# Patient Record
Sex: Male | Born: 1950 | Race: Black or African American | Hispanic: No | Smoking: Former smoker
Health system: Southern US, Community
[De-identification: ages and names within clinical notes are randomized; demographics above are authoritative.]

## PROBLEM LIST (undated history)

## (undated) DIAGNOSIS — M1990 Unspecified osteoarthritis, unspecified site: Secondary | ICD-10-CM

## (undated) DIAGNOSIS — K529 Noninfective gastroenteritis and colitis, unspecified: Secondary | ICD-10-CM

## (undated) DIAGNOSIS — N289 Disorder of kidney and ureter, unspecified: Secondary | ICD-10-CM

## (undated) DIAGNOSIS — J449 Chronic obstructive pulmonary disease, unspecified: Secondary | ICD-10-CM

## (undated) DIAGNOSIS — K319 Disease of stomach and duodenum, unspecified: Secondary | ICD-10-CM

## (undated) DIAGNOSIS — I10 Essential (primary) hypertension: Secondary | ICD-10-CM

## (undated) DIAGNOSIS — I509 Heart failure, unspecified: Secondary | ICD-10-CM

## (undated) DIAGNOSIS — M069 Rheumatoid arthritis, unspecified: Secondary | ICD-10-CM

---

## 2012-11-18 ENCOUNTER — Emergency Department (HOSPITAL_COMMUNITY): Payer: Medicare Other

## 2012-11-18 ENCOUNTER — Emergency Department (HOSPITAL_COMMUNITY)
Admission: EM | Admit: 2012-11-18 | Discharge: 2012-11-18 | Disposition: A | Discharge status: Home / Self Care | Payer: Medicare Other | Attending: Emergency Medicine | Admitting: Emergency Medicine

## 2012-11-18 ENCOUNTER — Encounter (HOSPITAL_COMMUNITY): Payer: Self-pay | Admitting: Emergency Medicine

## 2012-11-18 DIAGNOSIS — F17200 Nicotine dependence, unspecified, uncomplicated: Secondary | ICD-10-CM | POA: Diagnosis not present

## 2012-11-18 DIAGNOSIS — Z8739 Personal history of other diseases of the musculoskeletal system and connective tissue: Secondary | ICD-10-CM | POA: Insufficient documentation

## 2012-11-18 DIAGNOSIS — R300 Dysuria: Secondary | ICD-10-CM | POA: Diagnosis not present

## 2012-11-18 DIAGNOSIS — Z8719 Personal history of other diseases of the digestive system: Secondary | ICD-10-CM | POA: Insufficient documentation

## 2012-11-18 DIAGNOSIS — R109 Unspecified abdominal pain: Secondary | ICD-10-CM | POA: Diagnosis present

## 2012-11-18 DIAGNOSIS — I10 Essential (primary) hypertension: Secondary | ICD-10-CM | POA: Insufficient documentation

## 2012-11-18 DIAGNOSIS — R1012 Left upper quadrant pain: Secondary | ICD-10-CM | POA: Insufficient documentation

## 2012-11-18 HISTORY — DX: Unspecified osteoarthritis, unspecified site: M19.90

## 2012-11-18 HISTORY — DX: Disease of stomach and duodenum, unspecified: K31.9

## 2012-11-18 HISTORY — DX: Essential (primary) hypertension: I10

## 2012-11-18 HISTORY — DX: Noninfective gastroenteritis and colitis, unspecified: K52.9

## 2012-11-18 LAB — COMPREHENSIVE METABOLIC PANEL
Albumin: 3.3 g/dL — ABNORMAL LOW (ref 3.5–5.2)
Alkaline Phosphatase: 130 U/L — ABNORMAL HIGH (ref 39–117)
BUN: 10 mg/dL (ref 6–23)
Chloride: 96 mEq/L (ref 96–112)
Creatinine, Ser: 0.81 mg/dL (ref 0.50–1.35)
GFR calc Af Amer: >90 mL/min (ref >90)
Total Bilirubin: 0.4 mg/dL (ref 0.3–1.2)

## 2012-11-18 LAB — URINALYSIS, ROUTINE W REFLEX MICROSCOPIC
Bilirubin Urine: NEGATIVE
Ketones, ur: NEGATIVE mg/dL
Nitrite: NEGATIVE
Urobilinogen, UA: 0.2 mg/dL (ref 0.0–1.0)

## 2012-11-18 LAB — CBC WITH DIFFERENTIAL/PLATELET
Basophils Absolute: 0 10*3/uL (ref 0.0–0.1)
Basophils Relative: 0 % (ref 0–1)
Eosinophils Absolute: 0 10*3/uL (ref 0.0–0.7)
Eosinophils Relative: 0 % (ref 0–5)
HCT: 39.2 % (ref 39.0–52.0)
Hemoglobin: 13.6 g/dL (ref 13.0–17.0)
MCH: 32.4 pg (ref 26.0–34.0)
MCHC: 34.7 g/dL (ref 30.0–36.0)
MCV: 93.3 fL (ref 78.0–100.0)
Monocytes Absolute: 0.7 10*3/uL (ref 0.1–1.0)
Monocytes Relative: 4 % (ref 3–12)
RDW: 13.5 % (ref 11.5–15.5)

## 2012-11-18 LAB — LIPASE, BLOOD: Lipase: 19 U/L (ref 11–59)

## 2012-11-18 MED ORDER — SODIUM CHLORIDE 0.9 % IV BOLUS (SEPSIS)
1000.0000 mL | Freq: Once | INTRAVENOUS | Status: AC
  Administered 2012-11-18: 1000 mL via INTRAVENOUS

## 2012-11-18 MED ORDER — PROMETHAZINE HCL 25 MG PO TABS: 25.0000 mg | ORAL_TABLET | Freq: Four times a day (QID) | ORAL | Status: DC | PRN

## 2012-11-18 MED ORDER — ONDANSETRON HCL 4 MG/2ML IJ SOLN
4.0000 mg | Freq: Once | INTRAMUSCULAR | Status: AC
  Administered 2012-11-18: 4 mg via INTRAMUSCULAR

## 2012-11-18 MED ORDER — MORPHINE SULFATE 4 MG/ML IJ SOLN
4.0000 mg | Freq: Once | INTRAMUSCULAR | Status: AC
  Administered 2012-11-18: 4 mg via INTRAVENOUS
  Filled 2012-11-18: qty 1

## 2012-11-18 MED ORDER — IOHEXOL 300 MG/ML  SOLN
100.0000 mL | Freq: Once | INTRAMUSCULAR | Status: AC | PRN
  Administered 2012-11-18: 100 mL via INTRAVENOUS

## 2012-11-18 MED ORDER — MORPHINE SULFATE 4 MG/ML IJ SOLN
4.0000 mg | Freq: Once | INTRAMUSCULAR | Status: DC
  Filled 2012-11-18: qty 1

## 2012-11-18 MED ORDER — ONDANSETRON HCL 4 MG/2ML IJ SOLN
4.0000 mg | Freq: Once | INTRAMUSCULAR | Status: AC
  Administered 2012-11-18: 4 mg via INTRAVENOUS
  Filled 2012-11-18: qty 2

## 2012-11-18 MED ORDER — ONDANSETRON HCL 4 MG/2ML IJ SOLN
4.0000 mg | Freq: Once | INTRAMUSCULAR | Status: DC
  Filled 2012-11-18: qty 2

## 2012-11-18 MED ORDER — IOHEXOL 300 MG/ML  SOLN
50.0000 mL | Freq: Once | INTRAMUSCULAR | Status: AC | PRN
  Administered 2012-11-18: 50 mL via ORAL

## 2012-11-18 MED ORDER — MORPHINE SULFATE 4 MG/ML IJ SOLN
4.0000 mg | Freq: Once | INTRAMUSCULAR | Status: AC
  Administered 2012-11-18: 4 mg via INTRAMUSCULAR

## 2012-11-18 MED ORDER — OXYCODONE-ACETAMINOPHEN 5-325 MG PO TABS: 1.0000 | ORAL_TABLET | ORAL | Status: DC | PRN

## 2012-11-18 NOTE — ED Notes (Signed)
Left flank pain since yesterday. Denies urinary changes. lnbm this am, denies black or bloody stools. Denies injury

## 2012-11-18 NOTE — ED Notes (Signed)
Unable to obtain IV access after 4 attempts by 2 RNs. MD aware, orders changed to IM.

## 2012-11-18 NOTE — ED Provider Notes (Signed)
CSN: [id number]     Arrival date & time 11/18/12  1246 History  This chart was scribed for Donnetta Hutching, MD, by Yevette Edwards, ED Scribe. This patient was seen in room APA04/APA04 and the patient's care was started at 3:19 PM.  First MD Initiated Contact with Patient 11/18/12 1503     Chief Complaint  Patient presents with  . Flank Pain   (Consider location/radiation/quality/duration/timing/severity/associated sxs/prior Treatment) The history is provided by the patient. No language interpreter was used.   HPI Comments: Gabriel Davenport is a 62 y.o. male, with a h/o gastroenteritis, who presents to the Emergency Department complaining of persistent left-sided upper quadrant abdominal pain which began yesterday evening. He states that he has a h/o stomach issues, but the abdominal pain has never been this severe. The pt took two pills yesterday evening he was prescribed by the [facility] for stomach issues; he normally only takes one pill a day. He reports that ambulation increases the pain. He has not been able to eat due to the pain. He denies dysuria or hematuria. The pt has a h/o rheumatoid arthritis and HTN. He denies a h/o abdominal surgery. The pt is a daily smoker.   [facility]  Past Medical History  Diagnosis Date  . Arthritis   . Hypertension   . Stomach problems   . Gastroenteritis    History reviewed. No pertinent past surgical history. History reviewed. No pertinent family history. History  Substance Use Topics  . Smoking status: Current Every Day Smoker  . Smokeless tobacco: Not on file  . Alcohol Use: Yes     Comment: beer twice a week approx 1-2    Review of Systems  Constitutional: Negative for fever and chills.  Gastrointestinal: Positive for abdominal pain.  Genitourinary: Positive for difficulty urinating (Baseline). Negative for dysuria and hematuria.    Allergies  Review of patient's allergies indicates no known allergies.  Home Medications  No  current outpatient prescriptions on file.  Triage Vitals: BP 153/69  Pulse 65  Temp(Src) 98.5 F (36.9 C) (Oral)  Resp 18  Ht 5\' 3"  (1.6 m)  Wt 116 lb (52.617 kg)  BMI 20.55 kg/m2  SpO2 99%  Physical Exam  Nursing note and vitals reviewed. Constitutional: He is oriented to person, place, and time. He appears well-developed and well-nourished.  HENT:  Head: Normocephalic and atraumatic.  Eyes: Conjunctivae and EOM are normal. Pupils are equal, round, and reactive to light.  Neck: Normal range of motion. Neck supple.  Cardiovascular: Normal rate, regular rhythm and normal heart sounds.   Pulmonary/Chest: Effort normal and breath sounds normal.  Abdominal: Soft. Bowel sounds are normal. He exhibits mass. There is tenderness.  Pulsatile abdominal mass.   Musculoskeletal: Normal range of motion.  Neurological: He is alert and oriented to person, place, and time.  Skin: Skin is warm and dry.  Psychiatric: He has a normal mood and affect.    ED Course  Procedures (including critical care time)  DIAGNOSTIC STUDIES: Oxygen Saturation is 98% on room air, normal by my interpretation.    COORDINATION OF CARE:  3:26 PM- Discussed treatment plan with patient, and the patient agreed to the plan.   Labs Review Labs Reviewed  URINALYSIS, ROUTINE W REFLEX MICROSCOPIC - Abnormal; Notable for the following:    Hgb urine dipstick TRACE (*)    Protein, ur TRACE (*)    All other components within normal limits  URINE MICROSCOPIC-ADD ON   Results for orders  placed during the hospital encounter of 11/18/12  URINALYSIS, ROUTINE W REFLEX MICROSCOPIC      Result Value Range   Color, Urine YELLOW  YELLOW   APPearance CLEAR  CLEAR   Specific Gravity, Urine 1.015  1.005 - 1.030   pH 7.0  5.0 - 8.0   Glucose, UA NEGATIVE  NEGATIVE mg/dL   Hgb urine dipstick TRACE (*) NEGATIVE   Bilirubin Urine NEGATIVE  NEGATIVE   Ketones, ur NEGATIVE  NEGATIVE mg/dL   Protein, ur TRACE (*) NEGATIVE mg/dL    Urobilinogen, UA 0.2  0.0 - 1.0 mg/dL   Nitrite NEGATIVE  NEGATIVE   Leukocytes, UA NEGATIVE  NEGATIVE  URINE MICROSCOPIC-ADD ON      Result Value Range   Squamous Epithelial / LPF RARE  RARE   WBC, UA 0-2  <3 WBC/hpf   RBC / HPF 0-2  <3 RBC/hpf  CBC WITH DIFFERENTIAL      Result Value Range   WBC 14.9 (*) 4.0 - 10.5 K/uL   RBC 4.20 (*) 4.22 - 5.81 MIL/uL   Hemoglobin 13.6  13.0 - 17.0 g/dL   HCT 09.8  11.9 - 14.7 %   MCV 93.3  78.0 - 100.0 fL   MCH 32.4  26.0 - 34.0 pg   MCHC 34.7  30.0 - 36.0 g/dL   RDW 82.9  56.2 - 13.0 %   Platelets 267  150 - 400 K/uL   Neutrophils Relative % 84 (*) 43 - 77 %   Neutro Abs 12.5 (*) 1.7 - 7.7 K/uL   Lymphocytes Relative 12  12 - 46 %   Lymphs Abs 1.8  0.7 - 4.0 K/uL   Monocytes Relative 4  3 - 12 %   Monocytes Absolute 0.7  0.1 - 1.0 K/uL   Eosinophils Relative 0  0 - 5 %   Eosinophils Absolute 0.0  0.0 - 0.7 K/uL   Basophils Relative 0  0 - 1 %   Basophils Absolute 0.0  0.0 - 0.1 K/uL  COMPREHENSIVE METABOLIC PANEL      Result Value Range   Sodium 136  135 - 145 mEq/L   Potassium 3.9  3.5 - 5.1 mEq/L   Chloride 96  96 - 112 mEq/L   CO2 28  19 - 32 mEq/L   Glucose, Bld 105 (*) 70 - 99 mg/dL   BUN 10  6 - 23 mg/dL   Creatinine, Ser 8.65  0.50 - 1.35 mg/dL   Calcium 9.4  8.4 - 78.4 mg/dL   Total Protein 7.9  6.0 - 8.3 g/dL   Albumin 3.3 (*) 3.5 - 5.2 g/dL   AST 18  0 - 37 U/L   ALT 13  0 - 53 U/L   Alkaline Phosphatase 130 (*) 39 - 117 U/L   Total Bilirubin 0.4  0.3 - 1.2 mg/dL   GFR calc non Af Amer >90  >90 mL/min   GFR calc Af Amer >90  >90 mL/min  LIPASE, BLOOD      Result Value Range   Lipase 19  11 - 59 U/L   Imaging Review No results found.  EKG Interpretation   None     Ct Abdomen Pelvis W Contrast  11/18/2012   CLINICAL DATA:  Left upper abdominal pain.  EXAM: CT ABDOMEN AND PELVIS WITH CONTRAST  TECHNIQUE: Multidetector CT imaging of the abdomen and pelvis was performed using the standard protocol following  bolus administration of intravenous contrast.  CONTRAST:  50mL OMNIPAQUE  IOHEXOL 300 MG/ML SOLN, OMNIPAQUE IOHEXOL 300 MG/ML SOLN  COMPARISON:  None.  FINDINGS: Small left pleural effusion. Subsegmental atelectasis posteriorly in the visualized lung bases. Unremarkable liver, nondilated gallbladder, spleen, adrenal glands, pancreas, right kidney. Small left renal cysts, largest 18 mm exophytic from the lower pole . No hydronephrosis. Patchy aortoiliac arterial calcifications without aneurysm. Stomach, small bowel, and colon are nondilated. Moderate fecal material through the proximal colon, decompressed distally. Urinary bladder is distended with mild diffuse wall thickening. Mild prostatic prominence. No ascites. No free air. No definite adenopathy. Portal vein patent. Regional bones unremarkable.  IMPRESSION: 1. No acute abdominal process. 2. Small left pleural effusion.   Electronically Signed   By: Oley Balm M.D.   On: 11/18/2012 19:28   US Aorta  11/18/2012   CLINICAL DATA:  Abdominal aortic aneurysm.  EXAM: ULTRASOUND OF ABDOMINAL AORTA  TECHNIQUE: Ultrasound examination of the abdominal aorta was performed to evaluate for abdominal aortic aneurysm.  COMPARISON:  None.  FINDINGS: Abdominal Aorta  No aneurysm identified.  Maximum AP  Diameter:  24 mm  Maximum TRV  Diameter: 19.5 mm  Right common iliac artery measures 7 mm. Left common iliac artery measures 11 mm.  IMPRESSION: Negative for aortic or iliac artery aneurysm.   Electronically Signed   By: Andreas Newport M.D.   On: 11/18/2012 17:31    MDM  No diagnosis found. No acute abdomen. Ultrasound and CT scan showed no acute pathology. No aneurysm noted. Discharge meds include Percocet and Phenergan 25 mg by mouth. Patient will return if worse   I personally performed the services described in this documentation, which was scribed in my presence. The recorded information has been reviewed and is accurate.     Donnetta Hutching,  MD 11/18/12 2116

## 2012-11-18 NOTE — ED Notes (Signed)
Patient drinking oral contrast.

## 2013-03-26 ENCOUNTER — Emergency Department (HOSPITAL_COMMUNITY): Payer: Medicare Other

## 2013-03-26 ENCOUNTER — Emergency Department (HOSPITAL_COMMUNITY)
Admission: EM | Admit: 2013-03-26 | Discharge: 2013-03-26 | Disposition: A | Discharge status: Home / Self Care | Payer: Medicare Other | Attending: Emergency Medicine | Admitting: Emergency Medicine

## 2013-03-26 ENCOUNTER — Encounter (HOSPITAL_COMMUNITY): Payer: Self-pay | Admitting: Emergency Medicine

## 2013-03-26 DIAGNOSIS — M129 Arthropathy, unspecified: Secondary | ICD-10-CM | POA: Insufficient documentation

## 2013-03-26 DIAGNOSIS — J189 Pneumonia, unspecified organism: Secondary | ICD-10-CM

## 2013-03-26 DIAGNOSIS — I10 Essential (primary) hypertension: Secondary | ICD-10-CM | POA: Insufficient documentation

## 2013-03-26 DIAGNOSIS — Z8719 Personal history of other diseases of the digestive system: Secondary | ICD-10-CM | POA: Insufficient documentation

## 2013-03-26 DIAGNOSIS — F17200 Nicotine dependence, unspecified, uncomplicated: Secondary | ICD-10-CM | POA: Insufficient documentation

## 2013-03-26 DIAGNOSIS — J159 Unspecified bacterial pneumonia: Secondary | ICD-10-CM | POA: Insufficient documentation

## 2013-03-26 DIAGNOSIS — M549 Dorsalgia, unspecified: Secondary | ICD-10-CM | POA: Insufficient documentation

## 2013-03-26 MED ORDER — LEVOFLOXACIN 500 MG PO TABS: 500.0000 mg | ORAL_TABLET | Freq: Every day | ORAL | Status: DC

## 2013-03-26 MED ORDER — ALBUTEROL SULFATE (2.5 MG/3ML) 0.083% IN NEBU
5.0000 mg | INHALATION_SOLUTION | Freq: Once | RESPIRATORY_TRACT | Status: AC
  Administered 2013-03-26: 5 mg via RESPIRATORY_TRACT
  Filled 2013-03-26: qty 6

## 2013-03-26 MED ORDER — ALBUTEROL SULFATE HFA 108 (90 BASE) MCG/ACT IN AERS: 1.0000 | INHALATION_SPRAY | Freq: Four times a day (QID) | RESPIRATORY_TRACT | Status: AC | PRN

## 2013-03-26 NOTE — Discharge Instructions (Signed)
Follow up with your md in 1 week or sooner if problems

## 2013-03-26 NOTE — ED Notes (Signed)
RT called for ned tx.

## 2013-03-26 NOTE — ED Notes (Signed)
RT in room for neb tx

## 2013-03-26 NOTE — ED Notes (Signed)
Pt states productive cough, clear in color x 2 weeks. States pain to right chest and back when coughing. States he can hear himself wheezing at times. Lung sounds clear at present time. Pt also states swollen lymph nodes. Also states sob since cough started, no distress noted.

## 2013-03-26 NOTE — ED Provider Notes (Signed)
CSN: [id number]     Arrival date & time 03/26/13  1531 History  This chart was scribed for Maudry Diego, MD, by Jenne Campus, ED Scribe. The patient was seen in room APA14/APA14 and the patient's care was started at 4:07 PM.   First MD Initiated Contact with Patient 03/26/13 1556     Chief Complaint  Patient presents with  . Cough    Patient is a 63 y.o. male presenting with cough. The history is provided by the patient. No language interpreter was used.  Cough Cough characteristics:  Productive Sputum characteristics:  Clear Duration:  2 weeks Timing:  Constant Progression:  Worsening Associated symptoms: chest pain (with coughing), fever and wheezing   Associated symptoms: no eye discharge, no headaches and no rash    HPI Comments: Gabriel Davenport is a 63 y.o. male who presents to the Emergency Department complaining of 2 weeks of cough productive of clear mucus with associated, intermittent wheezing, fever and CP and back pain with coughing. He has been seen by the [facility] for the same w/o improvement. He has no h/o chronic pulmonary conditions. He is a current everyday smoker and occasional alcohol user.  Past Medical History  Diagnosis Date  . Arthritis   . Hypertension   . Stomach problems   . Gastroenteritis    History reviewed. No pertinent past surgical history. No family history on file. History  Substance Use Topics  . Smoking status: Current Every Day Smoker    Types: Cigarettes  . Smokeless tobacco: Not on file  . Alcohol Use: Yes     Comment: beer twice a week approx 1-2    Review of Systems  Constitutional: Positive for fever. Negative for appetite change and fatigue.  HENT: Negative for congestion, ear discharge and sinus pressure.   Eyes: Negative for discharge.  Respiratory: Positive for cough and wheezing.   Cardiovascular: Positive for chest pain (with coughing).  Gastrointestinal: Negative for abdominal pain and diarrhea.  Genitourinary: Negative  for frequency and hematuria.  Musculoskeletal: Positive for back pain (with coughing).  Skin: Negative for rash.  Neurological: Negative for seizures and headaches.  Psychiatric/Behavioral: Negative for hallucinations.     Allergies  Other  Home Medications   Current Outpatient Rx  Name  Route  Sig  Dispense  Refill  . oxyCODONE-acetaminophen (PERCOCET) 5-325 MG per tablet   Oral   Take 1 tablet by mouth every 4 (four) hours as needed for pain.   20 tablet   0   . promethazine (PHENERGAN) 25 MG tablet   Oral   Take 1 tablet (25 mg total) by mouth every 6 (six) hours as needed for nausea.   20 tablet   0    Triage Vitals: BP 171/81  Pulse 107  Temp(Src) 98.5 F (36.9 C)  Resp 18  Ht 5\' 3"  (1.6 m)  Wt 116 lb (52.617 kg)  BMI 20.55 kg/m2  SpO2 98%  Physical Exam  Nursing note and vitals reviewed. Constitutional: He is oriented to person, place, and time. He appears well-developed and well-nourished.  HENT:  Head: Normocephalic and atraumatic.  Eyes: Conjunctivae and EOM are normal. No scleral icterus.  Neck: Neck supple. No thyromegaly present.  Cardiovascular: Normal rate and regular rhythm.  Exam reveals no gallop and no friction rub.   No murmur heard. Pulmonary/Chest: Effort normal. No stridor. He has wheezes (minimal bilaterally ). He has no rales. He exhibits no tenderness.  Abdominal: He exhibits no distension.  Musculoskeletal: Normal range  of motion. He exhibits no edema.  Lymphadenopathy:    He has no cervical adenopathy.  Neurological: He is alert and oriented to person, place, and time. He exhibits normal muscle tone. Coordination normal.  Skin: Skin is warm and dry. No rash noted. No erythema.  Psychiatric: He has a normal mood and affect. His behavior is normal.    ED Course  Procedures (including critical care time)  Medications  albuterol (PROVENTIL) (2.5 MG/3ML) 0.083% nebulizer solution 5 mg (5 mg Nebulization Given 03/26/13 1635)     DIAGNOSTIC STUDIES: Oxygen Saturation is 98% on RA, normal by my interpretation.    COORDINATION OF CARE: 4:10 PM-Discussed treatment plan which includes CXR and breathing tx with pt at bedside and pt agreed to plan.   4:49 PM-Pt rechecked and feels improved with medications listed above. Informed pt that CXR shows PNA.Upon re-exam, Discussed discharge plan which includes antibiotics with pt and pt agreed to plan. Also [facility] and pt agreed. Addressed symptoms to return for with pt.   Labs Review Labs Reviewed - No data to display Imaging Review  Dg Chest 2 View  03/26/2013   CLINICAL DATA:  Productive cough and shortness of breath.  EXAM: CHEST  2 VIEW  COMPARISON:  CT chest 11/18/2012  FINDINGS: The heart size is normal. Mild emphysematous changes are again noted. The left lower lobe pneumonia is present. A small left pleural effusion is evident as well. The upper lung fields are clear. The visualized soft tissues and bony thorax are unremarkable.  IMPRESSION: 1. Left lower lobe pneumonia. 2. Small left pleural effusion. 3. Emphysema.   Electronically Signed   By: Lawrence Santiago M.D.   On: 03/26/2013 16:40     EKG Interpretation None      MDM   Final diagnoses:  None   The chart was scribed for me under my direct supervision.  I personally performed the history, physical, and medical decision making and all procedures in the evaluation of this patient.Maudry Diego, MD 03/26/13 [phone]

## 2013-04-23 ENCOUNTER — Emergency Department (HOSPITAL_COMMUNITY)
Admission: EM | Admit: 2013-04-23 | Discharge: 2013-04-23 | Disposition: A | Discharge status: Home / Self Care | Payer: Medicare Other | Attending: Emergency Medicine | Admitting: Emergency Medicine

## 2013-04-23 ENCOUNTER — Emergency Department (HOSPITAL_COMMUNITY): Payer: Medicare Other

## 2013-04-23 ENCOUNTER — Encounter (HOSPITAL_COMMUNITY): Payer: Self-pay | Admitting: Emergency Medicine

## 2013-04-23 DIAGNOSIS — I10 Essential (primary) hypertension: Secondary | ICD-10-CM | POA: Diagnosis not present

## 2013-04-23 DIAGNOSIS — Z8701 Personal history of pneumonia (recurrent): Secondary | ICD-10-CM | POA: Insufficient documentation

## 2013-04-23 DIAGNOSIS — F17200 Nicotine dependence, unspecified, uncomplicated: Secondary | ICD-10-CM | POA: Diagnosis not present

## 2013-04-23 DIAGNOSIS — Z79899 Other long term (current) drug therapy: Secondary | ICD-10-CM | POA: Insufficient documentation

## 2013-04-23 DIAGNOSIS — J449 Chronic obstructive pulmonary disease, unspecified: Secondary | ICD-10-CM

## 2013-04-23 DIAGNOSIS — J4489 Other specified chronic obstructive pulmonary disease: Secondary | ICD-10-CM | POA: Insufficient documentation

## 2013-04-23 DIAGNOSIS — M129 Arthropathy, unspecified: Secondary | ICD-10-CM | POA: Diagnosis not present

## 2013-04-23 DIAGNOSIS — Z8719 Personal history of other diseases of the digestive system: Secondary | ICD-10-CM | POA: Diagnosis not present

## 2013-04-23 DIAGNOSIS — R059 Cough, unspecified: Secondary | ICD-10-CM | POA: Diagnosis present

## 2013-04-23 DIAGNOSIS — R05 Cough: Secondary | ICD-10-CM | POA: Diagnosis present

## 2013-04-23 DIAGNOSIS — M549 Dorsalgia, unspecified: Secondary | ICD-10-CM | POA: Insufficient documentation

## 2013-04-23 MED ORDER — PREDNISONE 10 MG PO TABS: ORAL_TABLET | ORAL | Status: DC

## 2013-04-23 MED ORDER — AZITHROMYCIN 250 MG PO TABS: 250.0000 mg | ORAL_TABLET | Freq: Every day | ORAL | Status: DC

## 2013-04-23 MED ORDER — HYDROCODONE-HOMATROPINE 5-1.5 MG/5ML PO SYRP: 5.0000 mL | ORAL_SOLUTION | Freq: Four times a day (QID) | ORAL | Status: DC | PRN

## 2013-04-23 NOTE — Discharge Instructions (Signed)
Your x-ray shows improvement in the left lower lobe pneumonia. There is some COPD present. And there is some small effusion at the base of your lungs. Please use the Zithromax as prescribed, please use prednisone taper daily. Use the cough medication as needed for cough and congestion. This medication may cause drowsiness, please use with caution. Please see your physician at the [facility] for additional followup and management of this resolving pneumonia and chronic lung disease.

## 2013-04-23 NOTE — ED Provider Notes (Signed)
Medical screening examination/treatment/procedure(s) were performed by non-physician practitioner and as supervising physician I was immediately available for consultation/collaboration.   EKG Interpretation None        Hoy Morn, MD 04/23/13 [phone]

## 2013-04-23 NOTE — ED Notes (Signed)
Pt states was dx with PNA a month ago. States he is sob and c/o pain in ribs with coughing and breathing. Diminished breath sounds in bases bilaterally.

## 2013-04-23 NOTE — ED Notes (Signed)
Pt c/o productive cough-clear sputum with bilateral rib and back pain. Pt states he "had pneumonia about 1 month ago and does not feel like he ever got over it".

## 2013-04-23 NOTE — ED Provider Notes (Signed)
CSN: [id number]     Arrival date & time 04/23/13  1003 History   First MD Initiated Contact with Patient 04/23/13 1057     Chief Complaint  Patient presents with  . Cough     (Consider location/radiation/quality/duration/timing/severity/associated sxs/prior Treatment) HPI Comments: Patient is a 63 year old male who was noted to have a left lower lobe pneumonia approximately one month ago. The patient states he is continuing to have problems with cough and congestion and feels that he may have never actually got over the pneumonia. He presents now for assistance and evaluation with this problem.  The patient states that he has not been coughing up any blood. He has not had any temperature elevations. He's not had any injury to his chest.  Patient is a 63 y.o. male presenting with cough. The history is provided by the patient.  Cough Associated symptoms: no chest pain, no eye discharge, no shortness of breath and no wheezing     Past Medical History  Diagnosis Date  . Arthritis   . Hypertension   . Stomach problems   . Gastroenteritis    History reviewed. No pertinent past surgical history. No family history on file. History  Substance Use Topics  . Smoking status: Current Every Day Smoker    Types: Cigarettes  . Smokeless tobacco: Not on file  . Alcohol Use: Yes     Comment: beer twice a week approx 1-2    Review of Systems  Constitutional: Negative for activity change.       All ROS Neg except as noted in HPI  HENT: Negative for nosebleeds.   Eyes: Negative for photophobia and discharge.  Respiratory: Positive for cough. Negative for shortness of breath and wheezing.   Cardiovascular: Negative for chest pain and palpitations.  Gastrointestinal: Negative for abdominal pain and blood in stool.  Genitourinary: Negative for dysuria, frequency and hematuria.  Musculoskeletal: Positive for back pain. Negative for arthralgias and neck pain.  Skin: Negative.   Neurological:  Negative for dizziness, seizures and speech difficulty.  Psychiatric/Behavioral: Negative for hallucinations and confusion.      Allergies  Other  Home Medications   Current Outpatient Rx  Name  Route  Sig  Dispense  Refill  . albuterol (PROVENTIL HFA;VENTOLIN HFA) 108 (90 BASE) MCG/ACT inhaler   Inhalation   Inhale 1-2 puffs into the lungs every 6 (six) hours as needed for wheezing or shortness of breath.   1 Inhaler   0   . amLODipine (NORVASC) 5 MG tablet   Oral   Take 5 mg by mouth daily.         . cholecalciferol (VITAMIN D) 1000 UNITS tablet   Oral   Take 1,000 Units by mouth daily.         . methotrexate (RHEUMATREX) 2.5 MG tablet   Oral   Take 12.5 mg by mouth once a week. Takes on Tuesday.  Caution:Chemotherapy. Protect from light.          BP 116/96  Pulse 91  Temp(Src) 98.5 F (36.9 C)  Resp 18  Ht 5\' 3"  (1.6 m)  Wt 116 lb (52.617 kg)  BMI 20.55 kg/m2  SpO2 96% Physical Exam  Nursing note and vitals reviewed. Constitutional: He is oriented to person, place, and time. He appears well-developed and well-nourished.  Non-toxic appearance.  HENT:  Head: Normocephalic.  Right Ear: Tympanic membrane and external ear normal.  Left Ear: Tympanic membrane and external ear normal.  Eyes: EOM and lids are normal.  Pupils are equal, round, and reactive to light.  Neck: Normal range of motion. Neck supple. Carotid bruit is not present.  Cardiovascular: Normal rate, regular rhythm, normal heart sounds, intact distal pulses and normal pulses.   Pulmonary/Chest: Breath sounds normal. No respiratory distress.  Coarse breath sounds with a few scattered rhonchi. Patient speaks in complete sentences.  Abdominal: Soft. Bowel sounds are normal. There is no tenderness. There is no guarding.  Musculoskeletal: Normal range of motion. He exhibits no edema.  Lymphadenopathy:       Head (right side): No submandibular adenopathy present.       Head (left side): No  submandibular adenopathy present.    He has no cervical adenopathy.  Neurological: He is alert and oriented to person, place, and time. He has normal strength. No cranial nerve deficit or sensory deficit.  Skin: Skin is warm and dry.  Psychiatric: He has a normal mood and affect. His speech is normal.    ED Course There was a computer malfunction between the x-ray department in the emergency department that caused a delay in obtaining the results for this patient's x-rays.   Procedures (including critical care time) Labs Review Labs Reviewed - No data to display Imaging Review No results found.   EKG Interpretation None      MDM X-ray of the chest reveals a resolving left lower lobe infiltrate. There is small bilateral effusions present with the left being greater than the right. There is also chronic obstructive pulmonary disease findings present. The pulse oximetry is 96% on room air. Within normal limits by my interpretation. The remainder the vital signs are well within normal limits. The patient speaks in complete sentences without problem and is in no distress.  Plan at this time is for the patient to be treated with a steroid taper, as well as Zithromax and cough medication. Patient states he will be following up with his doctors at the [facility].    Final diagnoses:  None    **I have reviewed nursing notes, vital signs, and all appropriate lab and imaging results for this patient.Lenox Ahr, PA-C 04/23/13 1317

## 2013-05-18 ENCOUNTER — Inpatient Hospital Stay (HOSPITAL_COMMUNITY)
Admission: EM | Admit: 2013-05-18 | Discharge: 2013-05-23 | DRG: 840 | Disposition: A | Discharge status: Other Acute Inpatient Hospital | Payer: Medicare Other | Attending: Family Medicine | Admitting: Family Medicine

## 2013-05-18 ENCOUNTER — Emergency Department (HOSPITAL_COMMUNITY): Payer: Medicare Other

## 2013-05-18 ENCOUNTER — Encounter (HOSPITAL_COMMUNITY): Payer: Self-pay | Admitting: Emergency Medicine

## 2013-05-18 DIAGNOSIS — K5900 Constipation, unspecified: Secondary | ICD-10-CM | POA: Diagnosis present

## 2013-05-18 DIAGNOSIS — R61 Generalized hyperhidrosis: Secondary | ICD-10-CM

## 2013-05-18 DIAGNOSIS — R509 Fever, unspecified: Secondary | ICD-10-CM

## 2013-05-18 DIAGNOSIS — D696 Thrombocytopenia, unspecified: Secondary | ICD-10-CM

## 2013-05-18 DIAGNOSIS — R599 Enlarged lymph nodes, unspecified: Secondary | ICD-10-CM

## 2013-05-18 DIAGNOSIS — E860 Dehydration: Secondary | ICD-10-CM | POA: Diagnosis present

## 2013-05-18 DIAGNOSIS — J441 Chronic obstructive pulmonary disease with (acute) exacerbation: Secondary | ICD-10-CM

## 2013-05-18 DIAGNOSIS — E875 Hyperkalemia: Secondary | ICD-10-CM | POA: Diagnosis present

## 2013-05-18 DIAGNOSIS — R188 Other ascites: Secondary | ICD-10-CM

## 2013-05-18 DIAGNOSIS — J9601 Acute respiratory failure with hypoxia: Secondary | ICD-10-CM

## 2013-05-18 DIAGNOSIS — J9600 Acute respiratory failure, unspecified whether with hypoxia or hypercapnia: Secondary | ICD-10-CM | POA: Diagnosis not present

## 2013-05-18 DIAGNOSIS — Z87891 Personal history of nicotine dependence: Secondary | ICD-10-CM

## 2013-05-18 DIAGNOSIS — J189 Pneumonia, unspecified organism: Secondary | ICD-10-CM

## 2013-05-18 DIAGNOSIS — E43 Unspecified severe protein-calorie malnutrition: Secondary | ICD-10-CM | POA: Insufficient documentation

## 2013-05-18 DIAGNOSIS — E872 Acidosis, unspecified: Secondary | ICD-10-CM

## 2013-05-18 DIAGNOSIS — R591 Generalized enlarged lymph nodes: Secondary | ICD-10-CM | POA: Diagnosis present

## 2013-05-18 DIAGNOSIS — C8589 Other specified types of non-Hodgkin lymphoma, extranodal and solid organ sites: Principal | ICD-10-CM | POA: Diagnosis present

## 2013-05-18 DIAGNOSIS — D638 Anemia in other chronic diseases classified elsewhere: Secondary | ICD-10-CM | POA: Diagnosis present

## 2013-05-18 DIAGNOSIS — I10 Essential (primary) hypertension: Secondary | ICD-10-CM

## 2013-05-18 DIAGNOSIS — M069 Rheumatoid arthritis, unspecified: Secondary | ICD-10-CM | POA: Diagnosis present

## 2013-05-18 DIAGNOSIS — Z681 Body mass index (BMI) 19 or less, adult: Secondary | ICD-10-CM

## 2013-05-18 DIAGNOSIS — I2789 Other specified pulmonary heart diseases: Secondary | ICD-10-CM | POA: Diagnosis present

## 2013-05-18 DIAGNOSIS — J90 Pleural effusion, not elsewhere classified: Secondary | ICD-10-CM | POA: Diagnosis present

## 2013-05-18 DIAGNOSIS — D649 Anemia, unspecified: Secondary | ICD-10-CM

## 2013-05-18 DIAGNOSIS — N179 Acute kidney failure, unspecified: Secondary | ICD-10-CM

## 2013-05-18 HISTORY — DX: Rheumatoid arthritis, unspecified: M06.9

## 2013-05-18 LAB — CBC WITH DIFFERENTIAL/PLATELET
BASOS PCT: 0 % (ref 0–1)
Basophils Absolute: 0 10*3/uL (ref 0.0–0.1)
EOS ABS: 0 10*3/uL (ref 0.0–0.7)
EOS PCT: 0 % (ref 0–5)
HCT: 29.6 % — ABNORMAL LOW (ref 39.0–52.0)
HEMOGLOBIN: 9.6 g/dL — AB (ref 13.0–17.0)
LYMPHS ABS: 1.9 10*3/uL (ref 0.7–4.0)
Lymphocytes Relative: 21 % (ref 12–46)
MCH: 26.2 pg (ref 26.0–34.0)
MCHC: 32.4 g/dL (ref 30.0–36.0)
MCV: 80.7 fL (ref 78.0–100.0)
MONOS PCT: 9 % (ref 3–12)
Monocytes Absolute: 0.8 10*3/uL (ref 0.1–1.0)
Neutro Abs: 6.3 10*3/uL (ref 1.7–7.7)
Neutrophils Relative %: 69 % (ref 43–77)
Platelets: 72 10*3/uL — ABNORMAL LOW (ref 150–400)
RBC: 3.67 MIL/uL — AB (ref 4.22–5.81)
RDW: 17.5 % — ABNORMAL HIGH (ref 11.5–15.5)
WBC: 9.1 10*3/uL (ref 4.0–10.5)

## 2013-05-18 LAB — HEPATIC FUNCTION PANEL
ALK PHOS: 167 U/L — AB (ref 39–117)
ALT: <5 U/L (ref 0–53)
AST: 15 U/L (ref 0–37)
Albumin: 2.4 g/dL — ABNORMAL LOW (ref 3.5–5.2)
BILIRUBIN DIRECT: 0.2 mg/dL (ref 0.0–0.3)
BILIRUBIN INDIRECT: 0.6 mg/dL (ref 0.3–0.9)
Total Bilirubin: 0.8 mg/dL (ref 0.3–1.2)
Total Protein: 8.3 g/dL (ref 6.0–8.3)

## 2013-05-18 LAB — BASIC METABOLIC PANEL
BUN: 44 mg/dL — AB (ref 6–23)
CO2: 21 meq/L (ref 19–32)
CREATININE: 3.12 mg/dL — AB (ref 0.50–1.35)
Calcium: 8.5 mg/dL (ref 8.4–10.5)
Chloride: 92 mEq/L — ABNORMAL LOW (ref 96–112)
GFR calc Af Amer: 23 mL/min — ABNORMAL LOW (ref >90)
GFR calc non Af Amer: 20 mL/min — ABNORMAL LOW (ref >90)
Glucose, Bld: 117 mg/dL — ABNORMAL HIGH (ref 70–99)
Potassium: 4.9 mEq/L (ref 3.7–5.3)
Sodium: 130 mEq/L — ABNORMAL LOW (ref 137–147)

## 2013-05-18 MED ORDER — FOLIC ACID 1 MG PO TABS: 1.0000 mg | ORAL_TABLET | Freq: Every day | ORAL | Status: DC

## 2013-05-18 MED ORDER — ONDANSETRON HCL 4 MG PO TABS: 4.0000 mg | ORAL_TABLET | Freq: Four times a day (QID) | ORAL | Status: DC | PRN

## 2013-05-18 MED ORDER — HYDROXYCHLOROQUINE SULFATE 200 MG PO TABS
ORAL_TABLET | ORAL | Status: AC
  Filled 2013-05-18: qty 1

## 2013-05-18 MED ORDER — BUDESONIDE-FORMOTEROL FUMARATE 160-4.5 MCG/ACT IN AERO
2.0000 | INHALATION_SPRAY | Freq: Two times a day (BID) | RESPIRATORY_TRACT | Status: DC
  Administered 2013-05-18 – 2013-05-21 (×6): 2 via RESPIRATORY_TRACT
  Filled 2013-05-18: qty 6

## 2013-05-18 MED ORDER — GABAPENTIN 300 MG PO CAPS
300.0000 mg | ORAL_CAPSULE | Freq: Two times a day (BID) | ORAL | Status: DC
  Administered 2013-05-18 – 2013-05-20 (×4): 300 mg via ORAL
  Filled 2013-05-18 (×4): qty 1

## 2013-05-18 MED ORDER — OMEGA-3-ACID ETHYL ESTERS 1 G PO CAPS
2.0000 g | ORAL_CAPSULE | Freq: Every day | ORAL | Status: DC
  Administered 2013-05-19 – 2013-05-23 (×5): 2 g via ORAL
  Filled 2013-05-18 (×6): qty 2

## 2013-05-18 MED ORDER — BUDESONIDE-FORMOTEROL FUMARATE 160-4.5 MCG/ACT IN AERO
INHALATION_SPRAY | RESPIRATORY_TRACT | Status: AC
  Filled 2013-05-18: qty 6

## 2013-05-18 MED ORDER — HEPARIN SODIUM (PORCINE) 5000 UNIT/ML IJ SOLN
5000.0000 [IU] | Freq: Three times a day (TID) | INTRAMUSCULAR | Status: DC
  Administered 2013-05-18 – 2013-05-19 (×2): 5000 [IU] via SUBCUTANEOUS
  Filled 2013-05-18 (×2): qty 1

## 2013-05-18 MED ORDER — ALBUTEROL SULFATE HFA 108 (90 BASE) MCG/ACT IN AERS
1.0000 | INHALATION_SPRAY | Freq: Four times a day (QID) | RESPIRATORY_TRACT | Status: DC | PRN
  Filled 2013-05-18: qty 6.7

## 2013-05-18 MED ORDER — METHOTREXATE 2.5 MG PO TABS
12.5000 mg | ORAL_TABLET | ORAL | Status: DC
  Administered 2013-05-19: 12.5 mg via ORAL
  Filled 2013-05-18: qty 5

## 2013-05-18 MED ORDER — AMLODIPINE BESYLATE 5 MG PO TABS: 10.0000 mg | ORAL_TABLET | Freq: Every day | ORAL | Status: DC

## 2013-05-18 MED ORDER — TRAMADOL HCL 50 MG PO TABS
50.0000 mg | ORAL_TABLET | Freq: Four times a day (QID) | ORAL | Status: DC
  Administered 2013-05-18 – 2013-05-19 (×3): 50 mg via ORAL
  Filled 2013-05-18 (×3): qty 1

## 2013-05-18 MED ORDER — FOLIC ACID 1 MG PO TABS
1.0000 mg | ORAL_TABLET | Freq: Every day | ORAL | Status: DC
  Administered 2013-05-19 – 2013-05-23 (×5): 1 mg via ORAL
  Filled 2013-05-18 (×6): qty 1

## 2013-05-18 MED ORDER — VITAMIN D 1000 UNITS PO TABS
1000.0000 [IU] | ORAL_TABLET | Freq: Every day | ORAL | Status: DC
  Administered 2013-05-19 – 2013-05-23 (×5): 1000 [IU] via ORAL
  Filled 2013-05-18 (×7): qty 1

## 2013-05-18 MED ORDER — LEVOFLOXACIN IN D5W 500 MG/100ML IV SOLN: 500.0000 mg | INTRAVENOUS | Status: DC

## 2013-05-18 MED ORDER — ONDANSETRON HCL 4 MG/2ML IJ SOLN: 4.0000 mg | Freq: Four times a day (QID) | INTRAMUSCULAR | Status: DC | PRN

## 2013-05-18 MED ORDER — SODIUM CHLORIDE 0.9 % IV SOLN
INTRAVENOUS | Status: DC
  Administered 2013-05-19 – 2013-05-21 (×7): via INTRAVENOUS

## 2013-05-18 MED ORDER — ALBUTEROL SULFATE (2.5 MG/3ML) 0.083% IN NEBU
3.0000 mL | INHALATION_SOLUTION | Freq: Four times a day (QID) | RESPIRATORY_TRACT | Status: DC | PRN
  Administered 2013-05-18 – 2013-05-20 (×4): 3 mL via RESPIRATORY_TRACT
  Filled 2013-05-18 (×5): qty 3

## 2013-05-18 MED ORDER — PANTOPRAZOLE SODIUM 40 MG PO TBEC: 40.0000 mg | DELAYED_RELEASE_TABLET | Freq: Every day | ORAL | Status: DC

## 2013-05-18 MED ORDER — AMLODIPINE BESYLATE 5 MG PO TABS
10.0000 mg | ORAL_TABLET | Freq: Every day | ORAL | Status: DC
  Administered 2013-05-19 – 2013-05-23 (×5): 10 mg via ORAL
  Filled 2013-05-18 (×5): qty 2

## 2013-05-18 MED ORDER — PANTOPRAZOLE SODIUM 40 MG PO TBEC
40.0000 mg | DELAYED_RELEASE_TABLET | Freq: Every day | ORAL | Status: DC
  Administered 2013-05-19 – 2013-05-23 (×5): 40 mg via ORAL
  Filled 2013-05-18 (×5): qty 1

## 2013-05-18 MED ORDER — LEVOFLOXACIN IN D5W 500 MG/100ML IV SOLN
INTRAVENOUS | Status: AC
  Filled 2013-05-18: qty 100

## 2013-05-18 MED ORDER — LEVOFLOXACIN IN D5W 500 MG/100ML IV SOLN
500.0000 mg | INTRAVENOUS | Status: DC
  Administered 2013-05-18: 500 mg via INTRAVENOUS
  Filled 2013-05-18 (×2): qty 100

## 2013-05-18 MED ORDER — HYDROXYCHLOROQUINE SULFATE 200 MG PO TABS
200.0000 mg | ORAL_TABLET | Freq: Every day | ORAL | Status: DC
  Administered 2013-05-18 – 2013-05-20 (×3): 200 mg via ORAL
  Filled 2013-05-18 (×4): qty 1

## 2013-05-18 NOTE — ED Notes (Addendum)
Pt c/o intermittent painful and swollen lymph nodes in neck/axilla/groin x 3-4 weeks. Also states he has lost 18 lbs in 2-3 weeks.

## 2013-05-18 NOTE — ED Notes (Signed)
C/O pain rated 8/10 neck bilaterally, axilla bilaterally, and groin bilaterally. States [facility] told him it was his lymph [facility] but "not to worry about it".

## 2013-05-18 NOTE — ED Provider Notes (Signed)
CSN: [id number]     Arrival date & time 05/18/13  1044 History   This chart was scribed for Gabriel Diego, MD by Allena Earing, ED Scribe. This patient was seen in room APA09/APA09 and the patient's care was started at 3:12 PM .    Chief Complaint  Patient presents with  . Lymphadenopathy      Patient is a 63 y.o. male presenting with neck injury. The history is provided by the patient. No language interpreter was used.  Neck Injury This is a recurrent (lymphadenopathy) problem. The current episode started more than 1 week ago. The problem occurs constantly. The problem has been gradually worsening. Pertinent negatives include no chest pain, no abdominal pain and no headaches. Nothing aggravates the symptoms. Nothing relieves the symptoms. He has tried nothing for the symptoms.     HPI Comments: Salem Lembke is a 63 y.o. male who presents to the Emergency Department complaining of worsening lymphadenopathy present under his arms, his neck, and between his legs. He has been to the [place], but they could not do anything for him. He also reports fevers and chills.  Past Medical History  Diagnosis Date  . Arthritis   . Hypertension   . Stomach problems   . Gastroenteritis    History reviewed. No pertinent past surgical history. No family history on file. History  Substance Use Topics  . Smoking status: Former Smoker    Types: Cigarettes    Quit date: 04/27/2013  . Smokeless tobacco: Not on file  . Alcohol Use: No     Comment: beer twice a week approx 1-2    Review of Systems  Constitutional: Positive for fever and chills. Negative for appetite change and fatigue.  HENT: Negative for congestion, ear discharge and sinus pressure.   Eyes: Negative for discharge.  Respiratory: Negative for cough.   Cardiovascular: Negative for chest pain.  Gastrointestinal: Negative for abdominal pain and diarrhea.  Genitourinary: Negative for frequency and hematuria.  Musculoskeletal:  Negative for back pain.  Skin: Negative for rash.  Neurological: Negative for seizures and headaches.  Psychiatric/Behavioral: Negative for hallucinations.      Allergies  Other  Home Medications   Prior to Admission medications   Medication Sig Start Date End Date Taking? Authorizing Provider  albuterol (PROVENTIL HFA;VENTOLIN HFA) 108 (90 BASE) MCG/ACT inhaler Inhale 1-2 puffs into the lungs every 6 (six) hours as needed for wheezing or shortness of breath. 03/26/13   Gabriel Diego, MD  amLODipine (NORVASC) 5 MG tablet Take 5 mg by mouth daily.    Historical Provider, MD  azithromycin (ZITHROMAX) 250 MG tablet Take 1 tablet (250 mg total) by mouth daily. Take first 2 tablets together, then 1 every day until finished. 04/23/13   Lenox Ahr, PA-C  cholecalciferol (VITAMIN D) 1000 UNITS tablet Take 1,000 Units by mouth daily.    Historical Provider, MD  HYDROcodone-homatropine (HYCODAN) 5-1.5 MG/5ML syrup Take 5 mLs by mouth every 6 (six) hours as needed for cough. 04/23/13   Lenox Ahr, PA-C  methotrexate (RHEUMATREX) 2.5 MG tablet Take 12.5 mg by mouth once a week. Takes on Tuesday.  Caution:Chemotherapy. Protect from light.    Historical Provider, MD  predniSONE (DELTASONE) 10 MG tablet 5,4,3,2,1 - take with food 04/23/13   Lenox Ahr, PA-C   BP 141/83  Pulse 101  Temp(Src) 99.3 F (37.4 C)  Resp 18  Ht 5\' 3"  (1.6 m)  Wt 100 lb (45.36 kg)  BMI 17.72 kg/m2  SpO2 96% Physical Exam  Nursing note and vitals reviewed. Constitutional: He is oriented to person, place, and time. He appears well-developed.  HENT:  Head: Normocephalic.  Eyes: Conjunctivae and EOM are normal. No scleral icterus.  Neck: Neck supple. No thyromegaly present.  Swollen lymph nodes both sides of neck  Cardiovascular: Normal rate and regular rhythm.  Exam reveals no gallop and no friction rub.   No murmur heard. Pulmonary/Chest: No stridor. He has no wheezes. He has no rales. He exhibits no  tenderness.  Abdominal: He exhibits no distension. There is no tenderness. There is no rebound.  Musculoskeletal: Normal range of motion. He exhibits no edema.  Lymphadenopathy:    He has no cervical adenopathy.  Neurological: He is oriented to person, place, and time. He exhibits normal muscle tone. Coordination normal.  Skin: No rash noted. No erythema.  Psychiatric: He has a normal mood and affect. His behavior is normal.    ED Course  Procedures (including critical care time)  DIAGNOSTIC STUDIES: Oxygen Saturation is 96% on RA, normal by my interpretation.    COORDINATION OF CARE:   3:17 PM-Discussed treatment plan which includes labs with pt at bedside and pt agreed to plan.   Labs Review Labs Reviewed  CBC WITH DIFFERENTIAL - Abnormal; Notable for the following:    RBC 3.67 (*)    Hemoglobin 9.6 (*)    HCT 29.6 (*)    RDW 17.5 (*)    Platelets 72 (*)    All other components within normal limits  BASIC METABOLIC PANEL - Abnormal; Notable for the following:    Sodium 130 (*)    Chloride 92 (*)    Glucose, Bld 117 (*)    BUN 44 (*)    Creatinine, Ser 3.12 (*)    GFR calc non Af Amer 20 (*)    GFR calc Af Amer 23 (*)    All other components within normal limits    Imaging Review No results found.   EKG Interpretation None      MDM   Final diagnoses:  None   I personally performed the services described in this documentation, which was scribed in my presence. The recorded information has been reviewed and is accurate.   The chart was scribed for me under my direct supervision.  I personally performed the history, physical, and medical decision making and all procedures in the evaluation of this patient.Gabriel Diego, MD 05/18/13 [phone]

## 2013-05-18 NOTE — H&P (Addendum)
[facility] History and Physical  Jarrius Huaracha VOZ:[id number] DOB: 08-Feb-1950 DOA: 05/18/2013  Referring physician: ER. PCP: PROVIDER NOT IN SYSTEM   Chief Complaint: Lymphadenopathy, fever, weight loss.  HPI: Zadrian Mccauley is a 63 y.o. 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 He is also experienced about a 15 pound weight loss in the last 2 weeks and he has had night sweats and fevers. He has had a productive cough but no dyspnea. There is no chest pain. He comes in for further investigation and management.   Review of Systems:   HEENT:  No headaches, Difficulty swallowing,Tooth/dental problems,Sore throat,  No sneezing, itching, ear ache, nasal congestion, post nasal drip,  Cardio-vascular:  No chest pain, Orthopnea, PND, swelling in lower extremities, anasarca, dizziness, palpitations  GI:  No heartburn, indigestion, abdominal pain, nausea, vomiting, diarrhea, change in bowel habits, loss of appetite  Resp:  No shortness of breath with exertion or at rest. No excess mucus, no productive cough, No non-productive cough, No coughing up of blood.No change in color of mucus.No wheezing.No chest wall deformity  Skin:  no rash or lesions.  GU:  no dysuria, change in color of urine, no urgency or frequency. No flank pain.  Musculoskeletal:  No joint pain or swelling. No decreased range of motion. No back pain.  Psych:  No change in mood or affect. No depression or anxiety. No memory loss.   Past Medical History  Diagnosis Date  . Arthritis   . Hypertension   . Stomach problems   . Gastroenteritis    History reviewed. No pertinent past surgical history. Social History:  reports that he quit smoking about 3 weeks ago. His smoking use included Cigarettes. He smoked 0.00 packs per day. He does not have any smokeless tobacco history on file. He reports  that he does not drink alcohol or use illicit drugs.  Allergies  Allergen Reactions  . Other     "new antibiotic pill, caused a rash" unable to recall name of medication.     No family history on file.   Prior to Admission medications   Medication Sig Start Date End Date Taking? Authorizing Provider  albuterol (PROVENTIL HFA;VENTOLIN HFA) 108 (90 BASE) MCG/ACT inhaler Inhale 1-2 puffs into the lungs every 6 (six) hours as needed for wheezing or shortness of breath. 03/26/13  Yes Maudry Diego, MD  amLODipine (NORVASC) 10 MG tablet Take 10 mg by mouth daily.   Yes Historical Provider, MD  budesonide-formoterol (SYMBICORT) 160-4.5 MCG/ACT inhaler Inhale 2 puffs into the lungs 2 (two) times daily.   Yes Historical Provider, MD  cholecalciferol (VITAMIN D) 1000 UNITS tablet Take 1,000 Units by mouth daily.   Yes Historical Provider, MD  folic acid (FOLVITE) 1 MG tablet Take 1 mg by mouth daily.   Yes Historical Provider, MD  gabapentin (NEURONTIN) 300 MG capsule Take 300 mg by mouth 2 (two) times daily.   Yes Historical Provider, MD  hydroxychloroquine (PLAQUENIL) 200 MG tablet Take 200 mg by mouth daily.   Yes Historical Provider, MD  Omega-3 Fatty Acids (FISH OIL) 1000 MG CAPS Take 2,000 mg by mouth daily.   Yes Historical Provider, MD  omeprazole (PRILOSEC) 20 MG capsule Take 20 mg by mouth daily.   Yes Historical Provider, MD  traMADol (ULTRAM) 50 MG tablet Take 50 mg by mouth 4 (four) times daily.  Yes Historical Provider, MD  methotrexate (RHEUMATREX) 2.5 MG tablet Take 12.5 mg by mouth once a week. Takes on Tuesday.  Caution:Chemotherapy. Protect from light.    Historical Provider, MD   Physical Exam: Filed Vitals:   05/18/13 1633  BP: 127/64  Pulse: 110  Temp:   Resp:     BP 127/64  Pulse 110  Temp(Src) 99.3 F (37.4 C)  Resp 18  Ht 5\' 3"  (1.6 m)  Wt 45.36 kg (100 lb)  BMI 17.72 kg/m2  SpO2 96%  General:  Appears calm and comfortable. Somewhat cachectic. He has  supraclavicular, neck bilaterally, bilateral axillary and inguinal lymphadenopathy, small approximately 1 cm diameter mobile lymph nodes, which are not tender. Eyes: PERRL, normal lids, irises & conjunctiva ENT: grossly normal hearing, lips & tongue Neck: no LAD, masses or thyromegaly Cardiovascular: RRR, no m/r/g. No LE edema. Telemetry: SR, no arrhythmias  Respiratory: Few crackles at both bases.. Abdomen: soft, ntnd Skin: no rash or induration seen on limited exam Musculoskeletal: grossly normal tone BUE/BLE Psychiatric: grossly normal mood and affect, speech fluent and appropriate Neurologic: grossly non-focal.          Labs on Admission:  Basic Metabolic Panel:  Recent Labs Lab 05/18/13 1334  NA 130*  K 4.9  CL 92*  CO2 21  GLUCOSE 117*  BUN 44*  CREATININE 3.12*  CALCIUM 8.5   Liver Function Tests:  Recent Labs Lab 05/18/13 1334  AST 15  ALT <5  ALKPHOS 167*  BILITOT 0.8  PROT 8.3  ALBUMIN 2.4*     CBC:  Recent Labs Lab 05/18/13 1334  WBC 9.1  NEUTROABS 6.3  HGB 9.6*  HCT 29.6*  MCV 80.7  PLT 72*     Radiological Exams on Admission: Ct Abdomen Pelvis Wo Contrast  05/18/2013   CLINICAL DATA:  3-4 week history of swollen lymph nodes in the neck, axillary regions, and groin  EXAM: CT CHEST WITHOUT CONTRAST; CT ABDOMEN AND PELVIS WITHOUT CONTRAST  TECHNIQUE: Multidetector CT imaging of the chest was performed following the standard protocol without IV contrast.; Multidetector CT imaging of the abdomen and pelvis was performed following the standard protocol without intravenous contrast.  COMPARISON:  DG CHEST 2 VIEW dated 05/18/2013  FINDINGS: CT scan of the chest: The study is quite limited without IV contrast. The cardiac chambers are normal in size. There are small to moderate-sized bilateral pleural effusions layering posteriorly. There are enlarged supraclavicular lymph nodes bilaterally. There is mild soft tissue fullness in the hilar regions but  discrete lymph nodes cannot be clearly delineated. There is likely an enlarged lymph node in the lower right paratracheal region measuring at least 1.5 cm in short axis. There are retrosternal lymph nodes which are poorly defined. One measures 2.3 cm in short axis. There are numerous mildly enlarged axillary lymph nodes. The largest on the right measures 1.3 cm in short axis. At lung window settings there are emphysematous changes bilaterally. There is mild atelectasis versus pneumonia adjacent to the bilateral pleural effusions. No pulmonary parenchymal masses are demonstrated.  The thoracic vertebral bodies are preserved in height. The sternum and ribs exhibit no acute abnormalities.  CT scan of the abdomen and pelvis: The liver is normal in contour. There is mild intrahepatic ductal dilation. There is a subcentimeter hypodensity in the left hepatic lobe on image 60 which exhibits Hounsfield measurement of +6. This likely reflects a cyst. The gallbladder is mildly distended. There is mild gallbladder wall thickening. No stones are evident.  The pancreas is grossly normal where visualized. The spleen measures 10.3 cm AP x 9 cm transversely x 9.8 cm in superior to inferior dimension. The caliber of the abdominal aorta is normal. There are in large periaortic and pericaval lymph nodes. These are difficult to separate from adjacent soft tissues. One on image 75 measures 2.1 cm in short axis. The adrenal glands and kidneys are grossly normal. There is increased density within the mesenteric fat, and small amounts of fluid are present in the paracolic gutters with a moderate amount of fluid present within the pelvis. The urinary bladder is partially distended. The prostate gland and seminal vesicles are grossly normal. There is no inguinal hernia. There is no umbilical hernia.  The non-opacified loops of small and large bowel are grossly normal. No inflammatory changes are suspected. There is a paucity of subcutaneous  fat diffusely.  The lumbar vertebral bodies are preserved in height. There are no lytic or blastic bony lesions of the lumbar spine nor of the pelvis.  IMPRESSION: 1. There are enlarged lymph nodes within the supraclavicular regions, mediastinum, hilar regions, and axillary regions within the thorax. Within the abdomen and pelvis there are enlarged periaortic and pericaval lymph nodes. This is worrisome for a lymphoproliferative disorder such as lymphoma. 2. There are small to moderate-sized bilateral pleural effusions. There is a small to moderate amount of ascites. 3. 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 No pulmonary parenchymal masses are demonstrated. Atelectasis or pneumonia is present in both lower lobes adjacent to the pleural fluid collections. There is no evidence of CHF. 4. There is mild intrahepatic ductal dilation of uncertain etiology. The gallbladder exhibits mild wall thickening without evidence of stones. As best as can be determined no acute pancreatic lesion is demonstrated. There is no splenomegaly. No acute abnormality of the kidneys is demonstrated. No acute abnormality of the visualized skeletal structures is demonstrated. 5. The sensitivity of the study is limited overall by the lack of IV and oral contrast. 6. These results were called by telephone at the time of interpretation on 05/18/2013 at 6:07 PM to Dr. Bethann Berkshire , who verbally acknowledged these results.   Electronically Signed   By: David  [place]   On: 05/18/2013 18:11   Dg Chest 2 View  05/18/2013   CLINICAL DATA:  Left adenopathy.  EXAM: CHEST  2 VIEW  COMPARISON:  DG CHEST 2 VIEW dated 04/23/2013; CT ABD - PELV W/ CM dated 11/18/2012  FINDINGS: Mild fullness in the mediastinum noted. Mediastinal adenopathy or mass age cannot be excluded. Borderline cardiomegaly, pulmonary vascularity is normal. Bibasilar pulmonary infiltrates consistent with pneumonia noted. Associated atelectasis. Associated left pleural  effusion present.  IMPRESSION: 1. Mediastinal fullness, adenopathy cannot be excluded. 2. Bibasilar prominent pulmonary infiltrates with associated atelectasis. These infiltrates most consistent with pneumonia. 3. Left pleural effusion.   Electronically Signed   By: Maisie Fus  Register   On: 05/18/2013 15:49   Ct Chest Wo Contrast  05/18/2013   CLINICAL DATA:  3-4 week history of swollen lymph nodes in the neck, axillary regions, and groin  EXAM: CT CHEST WITHOUT CONTRAST; CT ABDOMEN AND PELVIS WITHOUT CONTRAST  TECHNIQUE: Multidetector CT imaging of the chest was performed following the standard protocol without IV contrast.; Multidetector CT imaging of the abdomen and pelvis was performed following the standard protocol without intravenous contrast.  COMPARISON:  DG CHEST 2 VIEW dated 05/18/2013  FINDINGS: CT scan of the chest: The study is quite limited without IV contrast. The  cardiac chambers are normal in size. There are small to moderate-sized bilateral pleural effusions layering posteriorly. There are enlarged supraclavicular lymph nodes bilaterally. There is mild soft tissue fullness in the hilar regions but discrete lymph nodes cannot be clearly delineated. There is likely an enlarged lymph node in the lower right paratracheal region measuring at least 1.5 cm in short axis. There are retrosternal lymph nodes which are poorly defined. One measures 2.3 cm in short axis. There are numerous mildly enlarged axillary lymph nodes. The largest on the right measures 1.3 cm in short axis. At lung window settings there are emphysematous changes bilaterally. There is mild atelectasis versus pneumonia adjacent to the bilateral pleural effusions. No pulmonary parenchymal masses are demonstrated.  The thoracic vertebral bodies are preserved in height. The sternum and ribs exhibit no acute abnormalities.  CT scan of the abdomen and pelvis: The liver is normal in contour. There is mild intrahepatic ductal dilation. There is  a subcentimeter hypodensity in the left hepatic lobe on image 60 which exhibits Hounsfield measurement of +6. This likely reflects a cyst. The gallbladder is mildly distended. There is mild gallbladder wall thickening. No stones are evident. The pancreas is grossly normal where visualized. The spleen measures 10.3 cm AP x 9 cm transversely x 9.8 cm in superior to inferior dimension. The caliber of the abdominal aorta is normal. There are in large periaortic and pericaval lymph nodes. These are difficult to separate from adjacent soft tissues. One on image 75 measures 2.1 cm in short axis. The adrenal glands and kidneys are grossly normal. There is increased density within the mesenteric fat, and small amounts of fluid are present in the paracolic gutters with a moderate amount of fluid present within the pelvis. The urinary bladder is partially distended. The prostate gland and seminal vesicles are grossly normal. There is no inguinal hernia. There is no umbilical hernia.  The non-opacified loops of small and large bowel are grossly normal. No inflammatory changes are suspected. There is a paucity of subcutaneous fat diffusely.  The lumbar vertebral bodies are preserved in height. There are no lytic or blastic bony lesions of the lumbar spine nor of the pelvis.  IMPRESSION: 1. There are enlarged lymph nodes within the supraclavicular regions, mediastinum, hilar regions, and axillary regions within the thorax. Within the abdomen and pelvis there are enlarged periaortic and pericaval lymph nodes. This is worrisome for a lymphoproliferative disorder such as lymphoma. 2. There are small to moderate-sized bilateral pleural effusions. There is a small to moderate amount of ascites. 3. 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 No pulmonary parenchymal masses are demonstrated. Atelectasis or pneumonia is present in both lower lobes adjacent to the pleural fluid collections. There is no evidence of CHF. 4. There  is mild intrahepatic ductal dilation of uncertain etiology. The gallbladder exhibits mild wall thickening without evidence of stones. As best as can be determined no acute pancreatic lesion is demonstrated. There is no splenomegaly. No acute abnormality of the kidneys is demonstrated. No acute abnormality of the visualized skeletal structures is demonstrated. 5. The sensitivity of the study is limited overall by the lack of IV and oral contrast. 6. These results were called by telephone at the time of interpretation on 05/18/2013 at 6:07 PM to Dr. Milton Ferguson , who verbally acknowledged these results.   Electronically Signed   By: David  [place]   On: 05/18/2013 18:11      Assessment/Plan   1. Widespread chest and abdominal lymphadenopathy  concerning for lymphoma, type B symptoms. 2. Acute renal failure secondary to dehydration. 3. Hypertension. 4. Thrombocytopenia and anemia, likely due to chronic disease with a possibility of bone marrow involvement. 5. Possible chest infection  Plan: 1. Admit to medical floor. 2. Intravenous fluids for hydration to improve his acute renal failure. 3. Oncology consultation. He will likely need biopsy of a lymph node. 4. Surgical consultation for lymph node biopsy. 5. Empirical intravenous antibiotics in case there is pneumonia.  Further recommendations will depend on patient's hospital progress.   Code Status: Full code.   Family Communication: I discussed the plan with the patient at the bedside.   Disposition Plan: Home when medically stable.  Time spent: 45 minutes.  [facility] Pager [phone].

## 2013-05-19 ENCOUNTER — Encounter (HOSPITAL_COMMUNITY): Payer: Self-pay | Admitting: Oncology

## 2013-05-19 ENCOUNTER — Inpatient Hospital Stay (HOSPITAL_COMMUNITY): Payer: Medicare Other

## 2013-05-19 DIAGNOSIS — N179 Acute kidney failure, unspecified: Secondary | ICD-10-CM

## 2013-05-19 DIAGNOSIS — M069 Rheumatoid arthritis, unspecified: Secondary | ICD-10-CM | POA: Diagnosis present

## 2013-05-19 DIAGNOSIS — D649 Anemia, unspecified: Secondary | ICD-10-CM

## 2013-05-19 DIAGNOSIS — E43 Unspecified severe protein-calorie malnutrition: Secondary | ICD-10-CM | POA: Insufficient documentation

## 2013-05-19 DIAGNOSIS — R509 Fever, unspecified: Secondary | ICD-10-CM

## 2013-05-19 DIAGNOSIS — I10 Essential (primary) hypertension: Secondary | ICD-10-CM

## 2013-05-19 DIAGNOSIS — N19 Unspecified kidney failure: Secondary | ICD-10-CM

## 2013-05-19 DIAGNOSIS — R599 Enlarged lymph nodes, unspecified: Secondary | ICD-10-CM

## 2013-05-19 DIAGNOSIS — D6959 Other secondary thrombocytopenia: Secondary | ICD-10-CM

## 2013-05-19 DIAGNOSIS — R61 Generalized hyperhidrosis: Secondary | ICD-10-CM

## 2013-05-19 DIAGNOSIS — E46 Unspecified protein-calorie malnutrition: Secondary | ICD-10-CM

## 2013-05-19 DIAGNOSIS — R112 Nausea with vomiting, unspecified: Secondary | ICD-10-CM

## 2013-05-19 DIAGNOSIS — R82998 Other abnormal findings in urine: Secondary | ICD-10-CM

## 2013-05-19 HISTORY — DX: Rheumatoid arthritis, unspecified: M06.9

## 2013-05-19 LAB — COMPREHENSIVE METABOLIC PANEL
AST: 12 U/L (ref 0–37)
Albumin: 1.8 g/dL — ABNORMAL LOW (ref 3.5–5.2)
Alkaline Phosphatase: 148 U/L — ABNORMAL HIGH (ref 39–117)
BUN: 50 mg/dL — ABNORMAL HIGH (ref 6–23)
CALCIUM: 7.8 mg/dL — AB (ref 8.4–10.5)
CO2: 19 mEq/L (ref 19–32)
CREATININE: 3.47 mg/dL — AB (ref 0.50–1.35)
Chloride: 96 mEq/L (ref 96–112)
GFR calc Af Amer: 20 mL/min — ABNORMAL LOW (ref >90)
GFR calc non Af Amer: 17 mL/min — ABNORMAL LOW (ref >90)
Glucose, Bld: 104 mg/dL — ABNORMAL HIGH (ref 70–99)
Potassium: 4.4 mEq/L (ref 3.7–5.3)
SODIUM: 131 meq/L — AB (ref 137–147)
TOTAL PROTEIN: 6.5 g/dL (ref 6.0–8.3)
Total Bilirubin: 0.5 mg/dL (ref 0.3–1.2)

## 2013-05-19 LAB — LACTATE DEHYDROGENASE: LDH: 169 U/L (ref 94–250)

## 2013-05-19 LAB — CBC
HEMATOCRIT: 25.1 % — AB (ref 39.0–52.0)
HEMOGLOBIN: 8.3 g/dL — AB (ref 13.0–17.0)
MCH: 26.6 pg (ref 26.0–34.0)
MCHC: 33.1 g/dL (ref 30.0–36.0)
MCV: 80.4 fL (ref 78.0–100.0)
Platelets: 51 10*3/uL — ABNORMAL LOW (ref 150–400)
RBC: 3.12 MIL/uL — ABNORMAL LOW (ref 4.22–5.81)
RDW: 17.7 % — ABNORMAL HIGH (ref 11.5–15.5)
WBC: 7.3 10*3/uL (ref 4.0–10.5)

## 2013-05-19 LAB — DIFFERENTIAL
Basophils Absolute: 0 10*3/uL (ref 0.0–0.1)
Basophils Relative: 1 % (ref 0–1)
Eosinophils Absolute: 0 10*3/uL (ref 0.0–0.7)
Eosinophils Relative: 0 % (ref 0–5)
Lymphocytes Relative: 18 % (ref 12–46)
Lymphs Abs: 1.1 10*3/uL (ref 0.7–4.0)
Monocytes Absolute: 0.6 10*3/uL (ref 0.1–1.0)
Monocytes Relative: 9 % (ref 3–12)
Neutro Abs: 4.4 10*3/uL (ref 1.7–7.7)
Neutrophils Relative %: 72 % (ref 43–77)

## 2013-05-19 LAB — URIC ACID: URIC ACID, SERUM: 11.9 mg/dL — AB (ref 4.0–7.8)

## 2013-05-19 LAB — SEDIMENTATION RATE: Sed Rate: 95 mm/hr — ABNORMAL HIGH (ref 0–16)

## 2013-05-19 LAB — C-REACTIVE PROTEIN: CRP: 16.5 mg/dL — AB (ref <0.60)

## 2013-05-19 MED ORDER — HEPARIN SOD (PORK) LOCK FLUSH 100 UNIT/ML IV SOLN: 250.0000 [IU] | INTRAVENOUS | Status: DC | PRN

## 2013-05-19 MED ORDER — ACETAMINOPHEN 325 MG PO TABS
650.0000 mg | ORAL_TABLET | Freq: Once | ORAL | Status: AC
  Administered 2013-05-21: 650 mg via ORAL
  Filled 2013-05-19: qty 2

## 2013-05-19 MED ORDER — SODIUM CHLORIDE 0.9 % IV SOLN: 250.0000 mL | Freq: Once | INTRAVENOUS | Status: DC

## 2013-05-19 MED ORDER — ENSURE COMPLETE PO LIQD
237.0000 mL | Freq: Two times a day (BID) | ORAL | Status: DC
  Administered 2013-05-20: 11:00:00 via ORAL
  Administered 2013-05-21: 237 mL via ORAL
  Administered 2013-05-22: 11:00:00 via ORAL
  Administered 2013-05-23 (×2): 237 mL via ORAL

## 2013-05-19 MED ORDER — ALBUTEROL SULFATE (2.5 MG/3ML) 0.083% IN NEBU
2.5000 mg | INHALATION_SOLUTION | Freq: Two times a day (BID) | RESPIRATORY_TRACT | Status: DC
  Administered 2013-05-19 – 2013-05-21 (×5): 2.5 mg via RESPIRATORY_TRACT
  Filled 2013-05-19 (×5): qty 3

## 2013-05-19 MED ORDER — LEVOFLOXACIN IN D5W 500 MG/100ML IV SOLN
500.0000 mg | INTRAVENOUS | Status: DC
  Filled 2013-05-19: qty 100

## 2013-05-19 MED ORDER — SODIUM CHLORIDE 0.9 % IJ SOLN: 3.0000 mL | INTRAMUSCULAR | Status: DC | PRN

## 2013-05-19 MED ORDER — DIPHENHYDRAMINE HCL 25 MG PO CAPS
25.0000 mg | ORAL_CAPSULE | Freq: Once | ORAL | Status: AC
  Administered 2013-05-21: 25 mg via ORAL
  Filled 2013-05-19: qty 1

## 2013-05-19 MED ORDER — SODIUM CHLORIDE 0.9 % IJ SOLN: 10.0000 mL | INTRAMUSCULAR | Status: DC | PRN

## 2013-05-19 MED ORDER — PROCHLORPERAZINE MALEATE 5 MG PO TABS: 10.0000 mg | ORAL_TABLET | Freq: Four times a day (QID) | ORAL | Status: DC | PRN

## 2013-05-19 MED ORDER — SIMETHICONE 80 MG PO CHEW
160.0000 mg | CHEWABLE_TABLET | Freq: Four times a day (QID) | ORAL | Status: DC | PRN
  Administered 2013-05-19: 160 mg via ORAL
  Filled 2013-05-19: qty 2

## 2013-05-19 MED ORDER — HYDROCODONE-ACETAMINOPHEN 5-325 MG PO TABS: 1.0000 | ORAL_TABLET | Freq: Four times a day (QID) | ORAL | Status: DC | PRN

## 2013-05-19 MED ORDER — FAMOTIDINE 20 MG PO TABS
20.0000 mg | ORAL_TABLET | Freq: Two times a day (BID) | ORAL | Status: DC | PRN
  Administered 2013-05-19 – 2013-05-23 (×2): 20 mg via ORAL
  Filled 2013-05-19 (×2): qty 1

## 2013-05-19 MED ORDER — ONDANSETRON HCL 4 MG PO TABS
8.0000 mg | ORAL_TABLET | Freq: Three times a day (TID) | ORAL | Status: DC
  Administered 2013-05-19 – 2013-05-23 (×11): 8 mg via ORAL
  Filled 2013-05-19 (×11): qty 2

## 2013-05-19 MED ORDER — METOCLOPRAMIDE HCL 10 MG PO TABS
10.0000 mg | ORAL_TABLET | Freq: Three times a day (TID) | ORAL | Status: DC
  Administered 2013-05-20 – 2013-05-23 (×10): 10 mg via ORAL
  Filled 2013-05-19 (×10): qty 1

## 2013-05-19 MED ORDER — TRAMADOL HCL 50 MG PO TABS
50.0000 mg | ORAL_TABLET | Freq: Two times a day (BID) | ORAL | Status: DC
  Administered 2013-05-19 – 2013-05-23 (×7): 50 mg via ORAL
  Filled 2013-05-19 (×8): qty 1

## 2013-05-19 MED ORDER — HEPARIN SOD (PORK) LOCK FLUSH 100 UNIT/ML IV SOLN: 500.0000 [IU] | Freq: Every day | INTRAVENOUS | Status: DC | PRN

## 2013-05-19 NOTE — Progress Notes (Signed)
INITIAL NUTRITION ASSESSMENT  DOCUMENTATION CODES Per approved criteria  -Severe malnutrition in the context of chronic illness -Underweight   Pt meets criteria for severe MALNUTRITION in the context of chronic illness as evidenced by severe muscle depletion, <75% estimated energy intake x 1 month, 13.8% wt loss x 1 month.  INTERVENTION: Ensure Complete po BID, each supplement provides 350 kcal and 13 grams of protein  NUTRITION DIAGNOSIS: Inadequate oral intake related to decreased appetite as evidenced by hx wt loss, severe fat and muscle depletion.   Goal: Pt will meet >90% of estimated nutritional needs  Monitor:  PO intake, labs, skin assessments, weight changes, I/O's  Reason for Assessment: MST=3  63 y.o. male  Admitting Dx: Lymphadenopathy  ASSESSMENT: Pt admitted with lymphadenopathy. Per H&P, work up is concerning for lymphoma. Pt is awaiting oncology consultation and lymph node biopsy.  He reports a general decline in health for the past 2-4 weeks ("I went from 'mini-Hulk' to a skeleton"). He reports his appetite has been nonexistent for the past month and is having difficulty swallowing foods. He reports he tolerates liquids well. No PO data currently available, but reports he ate a small bit of breakfast AM, which he unfortunately threw back up. He is very interested in trying Ensure to improve his nutritional status.  He reports UBW of 116-118# for most of his adult life. Wt hx reveals a 13.8% wt loss x 1 month, which is clinically significant.  Nutrition Focused Physical Exam:  Subcutaneous Fat:  Orbital Region: moderate depletion Upper Arm Region: severe depletion Thoracic and Lumbar Region: moderate depletion  Muscle:  Temple Region: moderate depletion Clavicle Bone Region: severe depletion Clavicle and Acromion Bone Region: severe depletion Scapular Bone Region: severe depletion Dorsal Hand: moderate depletion Patellar Region: severe depletion Anterior  Thigh Region: severe depletion Posterior Calf Region: severe depletion  Edema: none present  Height: Ht Readings from Last 1 Encounters:  05/18/13 5\' 3"  (1.6 m)    Weight: Wt Readings from Last 1 Encounters:  05/18/13 100 lb (45.36 kg)    Ideal Body Weight: 124#  % Ideal Body Weight: 81%  Wt Readings from Last 10 Encounters:  05/18/13 100 lb (45.36 kg)  04/23/13 116 lb (52.617 kg)  03/26/13 116 lb (52.617 kg)  11/18/12 116 lb (52.617 kg)    Usual Body Weight: 116#  % Usual Body Weight: 86%  BMI:  Body mass index is 17.72 kg/(m^2). Meets criteria for underweight.   Estimated Nutritional Needs: Kcal: 0630-1601 daily Protein: 58-68 grams daily Fluid: 1.4-1.6 L daily  Skin: WDL  Diet Order: General  EDUCATION NEEDS: -Education needs addressed   Intake/Output Summary (Last 24 hours) at 05/19/13 1351 Last data filed at 05/19/13 1321  Gross per 24 hour  Intake 1452.5 ml  Output      0 ml  Net 1452.5 ml    Last BM: 05/18/13  Labs:   Recent Labs Lab 05/18/13 1334 05/19/13 0506  NA 130* 131*  K 4.9 4.4  CL 92* 96  CO2 21 19  BUN 44* 50*  CREATININE 3.12* 3.47*  CALCIUM 8.5 7.8*  GLUCOSE 117* 104*    CBG (last 3)  No results found for this basename: GLUCAP,  in the last 72 hours  Scheduled Meds: . albuterol  2.5 mg Nebulization BID  . amLODipine  10 mg Oral Daily  . budesonide-formoterol  2 puff Inhalation BID  . cholecalciferol  1,000 Units Oral Daily  . folic acid  1 mg Oral Daily  .  gabapentin  300 mg Oral BID  . heparin  5,000 Units Subcutaneous 3 times per day  . hydroxychloroquine  200 mg Oral Q supper  . levofloxacin (LEVAQUIN) IV  500 mg Intravenous Q24H  . methotrexate  12.5 mg Oral Weekly  . omega-3 acid ethyl esters  2 g Oral Daily  . pantoprazole  40 mg Oral Daily  . traMADol  50 mg Oral QID    Continuous Infusions: . sodium chloride 150 mL/hr at 05/19/13 6269    Past Medical History  Diagnosis Date  . Arthritis   .  Hypertension   . Stomach problems   . Gastroenteritis     History reviewed. No pertinent past surgical history.  Jnya Brossard A. Jimmye Norman, RD, LDN Pager: [phone]

## 2013-05-19 NOTE — Progress Notes (Signed)
Kirsten from lab just called and informed me that it would take about 4 to 5 hours before his platelets would be available. She said everyone everywhere is all backed up and that she would call me when they are ready. Elmo Putt RN

## 2013-05-19 NOTE — Consult Note (Signed)
Chart reviewed  Awaiting Oncology recommendations.

## 2013-05-19 NOTE — Care Management Note (Unsigned)
    Page 1 of 1   05/19/2013     4:32:50 PM CARE MANAGEMENT NOTE 05/19/2013  Patient:  Gabriel Davenport, Gabriel Davenport   Account Number:  [id number]  Date Initiated:  05/19/2013  Documentation initiated by:  Vladimir Creeks  Subjective/Objective Assessment:   Pt admitted with ARF, enlarged lymph nodes, wt loss. He is from home, alone, but has  family available if needed. He will return home at D/C     Action/Plan:   Will follow for needs   Anticipated DC Date:  05/21/2013   Anticipated DC Plan:  [place]  CM consult      Choice offered to / List presented to:             Status of service:  In process, will continue to follow Medicare Important Message given?  YES (If response is "NO", the following Medicare IM given date fields will be blank) Date Medicare IM given:  05/19/2013 Date Additional Medicare IM given:    Discharge Disposition:    Per UR Regulation:  Reviewed for med. necessity/level of care/duration of stay  If discussed at [place] of Stay Meetings, dates discussed:    Comments:  05/19/13 [place] RN/CM

## 2013-05-19 NOTE — Progress Notes (Addendum)
[facility] PROGRESS NOTE  Gabriel Davenport WUJ:[id number] DOB: 1950-10-07 DOA: 05/18/2013 PCP: PROVIDER NOT IN SYSTEM  Assessment/Plan: Lymphadenopathy -Per CT scan multiple areas including supraclavicular, mediastinum, hilar, axillary, periaortic and pericaval. -Very concerning for lymphoma, especially coupled with fevers/night sweats/weight loss which can equate to B symptoms. -Will need a lymph node biopsy for diagnosis (cervical, supraclavicular or axillary will be the easiest to access).  -Surgery and oncology input requested.  ARF -Renal US without evidence of obstruction. -Continue IVF today and consider renal consult if no improvement tomorrow. -Recheck renal function in am.  Thrombocytopenia -Likely related to acute heme/onc process.  Code Status: Full Code Family Communication: Patient only  Disposition Plan: Home when ready.   Consultants:  Surgery  Oncology   Antibiotics:  None   Subjective: No major complaints.  Objective: Filed Vitals:   05/18/13 2213 05/19/13 0131 05/19/13 0626 05/19/13 0734  BP: 125/54  110/65   Pulse: 104  96   Temp: 98.6 F (37 C)  98.3 F (36.8 C)   TempSrc: Oral  Oral   Resp: 20  20   Height:      Weight:      SpO2: 98% 90% 98% 96%    Intake/Output Summary (Last 24 hours) at 05/19/13 1022 Last data filed at 05/19/13 0645  Gross per 24 hour  Intake  462.5 ml  Output      0 ml  Net  462.5 ml   Filed Weights   05/18/13 1112  Weight: 45.36 kg (100 lb)    Exam:   General:  AA Ox3  Cardiovascular: RRR  Respiratory: CTA B  Abdomen: S/NT/ND/+BS  Extremities: no C/C/E   Neurologic:  Non-focal, intact  Data Reviewed: Basic Metabolic Panel:  Recent Labs Lab 05/18/13 1334 05/19/13 0506  NA 130* 131*  K 4.9 4.4  CL 92* 96  CO2 21 19  GLUCOSE 117* 104*  BUN 44* 50*  CREATININE 3.12* 3.47*  CALCIUM 8.5 7.8*   Liver Function Tests:  Recent Labs Lab 05/18/13 1334 05/19/13 0506  AST 15  12  ALT <5 <5  ALKPHOS 167* 148*  BILITOT 0.8 0.5  PROT 8.3 6.5  ALBUMIN 2.4* 1.8*   No results found for this basename: LIPASE, AMYLASE,  in the last 168 hours No results found for this basename: AMMONIA,  in the last 168 hours CBC:  Recent Labs Lab 05/18/13 1334 05/19/13 0506  WBC 9.1 7.3  NEUTROABS 6.3  --   HGB 9.6* 8.3*  HCT 29.6* 25.1*  MCV 80.7 80.4  PLT 72* 51*   Cardiac Enzymes: No results found for this basename: CKTOTAL, CKMB, CKMBINDEX, TROPONINI,  in the last 168 hours BNP (last 3 results) No results found for this basename: PROBNP,  in the last 8760 hours CBG: No results found for this basename: GLUCAP,  in the last 168 hours  No results found for this or any previous visit (from the past 240 hour(s)).   Studies: Ct Abdomen Pelvis Wo Contrast  05/18/2013   CLINICAL DATA:  3-4 week history of swollen lymph nodes in the neck, axillary regions, and groin  EXAM: CT CHEST WITHOUT CONTRAST; CT ABDOMEN AND PELVIS WITHOUT CONTRAST  TECHNIQUE: Multidetector CT imaging of the chest was performed following the standard protocol without IV contrast.; Multidetector CT imaging of the abdomen and pelvis was performed following the standard protocol without intravenous contrast.  COMPARISON:  DG CHEST 2 VIEW dated 05/18/2013  FINDINGS: CT scan of the chest: The study  is quite limited without IV contrast. The cardiac chambers are normal in size. There are small to moderate-sized bilateral pleural effusions layering posteriorly. There are enlarged supraclavicular lymph nodes bilaterally. There is mild soft tissue fullness in the hilar regions but discrete lymph nodes cannot be clearly delineated. There is likely an enlarged lymph node in the lower right paratracheal region measuring at least 1.5 cm in short axis. There are retrosternal lymph nodes which are poorly defined. One measures 2.3 cm in short axis. There are numerous mildly enlarged axillary lymph nodes. The largest on the right  measures 1.3 cm in short axis. At lung window settings there are emphysematous changes bilaterally. There is mild atelectasis versus pneumonia adjacent to the bilateral pleural effusions. No pulmonary parenchymal masses are demonstrated.  The thoracic vertebral bodies are preserved in height. The sternum and ribs exhibit no acute abnormalities.  CT scan of the abdomen and pelvis: The liver is normal in contour. There is mild intrahepatic ductal dilation. There is a subcentimeter hypodensity in the left hepatic lobe on image 60 which exhibits Hounsfield measurement of +6. This likely reflects a cyst. The gallbladder is mildly distended. There is mild gallbladder wall thickening. No stones are evident. The pancreas is grossly normal where visualized. The spleen measures 10.3 cm AP x 9 cm transversely x 9.8 cm in superior to inferior dimension. The caliber of the abdominal aorta is normal. There are in large periaortic and pericaval lymph nodes. These are difficult to separate from adjacent soft tissues. One on image 75 measures 2.1 cm in short axis. The adrenal glands and kidneys are grossly normal. There is increased density within the mesenteric fat, and small amounts of fluid are present in the paracolic gutters with a moderate amount of fluid present within the pelvis. The urinary bladder is partially distended. The prostate gland and seminal vesicles are grossly normal. There is no inguinal hernia. There is no umbilical hernia.  The non-opacified loops of small and large bowel are grossly normal. No inflammatory changes are suspected. There is a paucity of subcutaneous fat diffusely.  The lumbar vertebral bodies are preserved in height. There are no lytic or blastic bony lesions of the lumbar spine nor of the pelvis.  IMPRESSION: 1. There are enlarged lymph nodes within the supraclavicular regions, mediastinum, hilar regions, and axillary regions within the thorax. Within the abdomen and pelvis there are enlarged  periaortic and pericaval lymph nodes. This is worrisome for a lymphoproliferative disorder such as lymphoma. 2. There are small to moderate-sized bilateral pleural effusions. There is a small to moderate amount of ascites. 3. 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 No pulmonary parenchymal masses are demonstrated. Atelectasis or pneumonia is present in both lower lobes adjacent to the pleural fluid collections. There is no evidence of CHF. 4. There is mild intrahepatic ductal dilation of uncertain etiology. The gallbladder exhibits mild wall thickening without evidence of stones. As best as can be determined no acute pancreatic lesion is demonstrated. There is no splenomegaly. No acute abnormality of the kidneys is demonstrated. No acute abnormality of the visualized skeletal structures is demonstrated. 5. The sensitivity of the study is limited overall by the lack of IV and oral contrast. 6. These results were called by telephone at the time of interpretation on 05/18/2013 at 6:07 PM to Dr. Milton Ferguson , who verbally acknowledged these results.   Electronically Signed   By: David  Martinique   On: 05/18/2013 18:11   Dg Chest 2 View  05/18/2013   CLINICAL DATA:  Left adenopathy.  EXAM: CHEST  2 VIEW  COMPARISON:  DG CHEST 2 VIEW dated 04/23/2013; CT ABD - PELV W/ CM dated 11/18/2012  FINDINGS: Mild fullness in the mediastinum noted. Mediastinal adenopathy or mass age cannot be excluded. Borderline cardiomegaly, pulmonary vascularity is normal. Bibasilar pulmonary infiltrates consistent with pneumonia noted. Associated atelectasis. Associated left pleural effusion present.  IMPRESSION: 1. Mediastinal fullness, adenopathy cannot be excluded. 2. Bibasilar prominent pulmonary infiltrates with associated atelectasis. These infiltrates most consistent with pneumonia. 3. Left pleural effusion.   Electronically Signed   By: Marcello Moores  Register   On: 05/18/2013 15:49   Ct Chest Wo Contrast  05/18/2013   CLINICAL  DATA:  3-4 week history of swollen lymph nodes in the neck, axillary regions, and groin  EXAM: CT CHEST WITHOUT CONTRAST; CT ABDOMEN AND PELVIS WITHOUT CONTRAST  TECHNIQUE: Multidetector CT imaging of the chest was performed following the standard protocol without IV contrast.; Multidetector CT imaging of the abdomen and pelvis was performed following the standard protocol without intravenous contrast.  COMPARISON:  DG CHEST 2 VIEW dated 05/18/2013  FINDINGS: CT scan of the chest: The study is quite limited without IV contrast. The cardiac chambers are normal in size. There are small to moderate-sized bilateral pleural effusions layering posteriorly. There are enlarged supraclavicular lymph nodes bilaterally. There is mild soft tissue fullness in the hilar regions but discrete lymph nodes cannot be clearly delineated. There is likely an enlarged lymph node in the lower right paratracheal region measuring at least 1.5 cm in short axis. There are retrosternal lymph nodes which are poorly defined. One measures 2.3 cm in short axis. There are numerous mildly enlarged axillary lymph nodes. The largest on the right measures 1.3 cm in short axis. At lung window settings there are emphysematous changes bilaterally. There is mild atelectasis versus pneumonia adjacent to the bilateral pleural effusions. No pulmonary parenchymal masses are demonstrated.  The thoracic vertebral bodies are preserved in height. The sternum and ribs exhibit no acute abnormalities.  CT scan of the abdomen and pelvis: The liver is normal in contour. There is mild intrahepatic ductal dilation. There is a subcentimeter hypodensity in the left hepatic lobe on image 60 which exhibits Hounsfield measurement of +6. This likely reflects a cyst. The gallbladder is mildly distended. There is mild gallbladder wall thickening. No stones are evident. The pancreas is grossly normal where visualized. The spleen measures 10.3 cm AP x 9 cm transversely x 9.8 cm in  superior to inferior dimension. The caliber of the abdominal aorta is normal. There are in large periaortic and pericaval lymph nodes. These are difficult to separate from adjacent soft tissues. One on image 75 measures 2.1 cm in short axis. The adrenal glands and kidneys are grossly normal. There is increased density within the mesenteric fat, and small amounts of fluid are present in the paracolic gutters with a moderate amount of fluid present within the pelvis. The urinary bladder is partially distended. The prostate gland and seminal vesicles are grossly normal. There is no inguinal hernia. There is no umbilical hernia.  The non-opacified loops of small and large bowel are grossly normal. No inflammatory changes are suspected. There is a paucity of subcutaneous fat diffusely.  The lumbar vertebral bodies are preserved in height. There are no lytic or blastic bony lesions of the lumbar spine nor of the pelvis.  IMPRESSION: 1. There are enlarged lymph nodes within the supraclavicular regions, mediastinum, hilar regions, and axillary regions  within the thorax. Within the abdomen and pelvis there are enlarged periaortic and pericaval lymph nodes. This is worrisome for a lymphoproliferative disorder such as lymphoma. 2. There are small to moderate-sized bilateral pleural effusions. There is a small to moderate amount of ascites. 3. 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 No pulmonary parenchymal masses are demonstrated. Atelectasis or pneumonia is present in both lower lobes adjacent to the pleural fluid collections. There is no evidence of CHF. 4. There is mild intrahepatic ductal dilation of uncertain etiology. The gallbladder exhibits mild wall thickening without evidence of stones. As best as can be determined no acute pancreatic lesion is demonstrated. There is no splenomegaly. No acute abnormality of the kidneys is demonstrated. No acute abnormality of the visualized skeletal structures is  demonstrated. 5. The sensitivity of the study is limited overall by the lack of IV and oral contrast. 6. These results were called by telephone at the time of interpretation on 05/18/2013 at 6:07 PM to Dr. Milton Ferguson , who verbally acknowledged these results.   Electronically Signed   By: David  [place]   On: 05/18/2013 18:11   US Renal  05/19/2013   CLINICAL DATA:  Acute renal failure, question obstruction secondary to lymphoma  EXAM: RENAL/URINARY TRACT ULTRASOUND COMPLETE  COMPARISON:  CT abdomen and pelvis 05/19/2006  FINDINGS: Right Kidney:  Length: 11.4 cm. Normal cortical thickness. Upper normal cortical echogenicity. No mass, hydronephrosis or shadowing calcification.  Left Kidney:  Length: 11.0 cm. Normal cortical thickness. Upper normal cortical echogenicity. Small cyst at lower pole 2.1 x 1.6 x 1.8 cm, simple features. No solid mass, hydronephrosis shadowing calcification.  Bladder:  Normally distended.  Ureteral jets were not visualized.  BILATERAL pleural effusions noted.  Small amounts of ascites noted in pelvis and at Morison's pouch.  IMPRESSION: No evidence of urinary tract obstruction.  Simple cyst inferior pole LEFT kidney 2.1 cm greatest size.  Small BILATERAL pleural effusions and minimal ascites.   Electronically Signed   By: Lavonia Dana M.D.   On: 05/19/2013 08:53    Scheduled Meds: . albuterol  2.5 mg Nebulization BID  . amLODipine  10 mg Oral Daily  . budesonide-formoterol  2 puff Inhalation BID  . cholecalciferol  1,000 Units Oral Daily  . folic acid  1 mg Oral Daily  . gabapentin  300 mg Oral BID  . heparin  5,000 Units Subcutaneous 3 times per day  . hydroxychloroquine  200 mg Oral Q supper  . levofloxacin (LEVAQUIN) IV  500 mg Intravenous Q24H  . methotrexate  12.5 mg Oral Weekly  . omega-3 acid ethyl esters  2 g Oral Daily  . pantoprazole  40 mg Oral Daily  . traMADol  50 mg Oral QID   Continuous Infusions: . sodium chloride 150 mL/hr at 05/19/13 [id number]     Principal Problem:   Lymphadenopathy Active Problems:   ARF (acute renal failure)   HTN (hypertension)   Fever   Night sweats    Time spent: 35 minutes. Greater than 50% of this time was spent in direct contact with the patient coordinating care.    [facility] Pager [phone]  If 7PM-7AM, please contact night-coverage at [email or web address], password [email or web address] 05/19/2013, 10:22 AM  LOS: 1 day

## 2013-05-19 NOTE — Consult Note (Signed)
[facility] Consultation Oncology  Name: Gabriel Davenport      MRN: [id number]    Location: A313/A313-01  Date: 05/19/2013 Time:5:34 PM   REFERRING PHYSICIAN:  Doree Albee, MD  REASON FOR CONSULT:  Lymphadenopathy   DIAGNOSIS:  Lymphadenopathy  HISTORY OF PRESENT ILLNESS:   Gabriel Davenport is a 63 year old [place] American man who presented to the [facility] ED after a 2-3 week history of "knots in throat."  He reports that he went saw his primary care physician at the [facility] hospital.  At that time, he notes that he had these "knots" but they were ignored by his medical team.    Gabriel Davenport reports that these "knots in his throat" have been present for 2-3 weeks.  He also notes "sore knots under my arms."  He reports a 16 lbs weight loss over 3-4 weeks.  He admits to drenching night sweats, decreased appetite, unintentional weight loss.  He quit smoking tobacco 2 weeks ago because "the doctor told me to quit."  He admits to a 40 pack year smoking history.  He also quit drinking EtOH 8 months ago, but admits that he was not a heavy drinker.  He has a history of rheumatoid arthritis x 3-4 years.  He initially did not know why he was on methotrexate or plaquenil, but when we started to discuss his RA, he remembered that he was on these medications for this condition.  He reports that his RA is under control with his current treatment.   I spent some time explaining what the "knots" are.  These are enlarged lymph nodes that are hard to palpation.   Following the [place] today, his case was discussed with Dr. Arnoldo Morale.  We recommended an excisional lymph node biopsy of an axillary lymph node.  I personally reviewed and went over laboratory results with the patient.  The results are noted within this dictation.  I personally reviewed and went over radiographic studies with the patient.  The results are noted within this dictation.    Chart is reviewed.   PAST MEDICAL  HISTORY:   Past Medical History  Diagnosis Date  . Arthritis   . Hypertension   . Stomach problems   . Gastroenteritis     ALLERGIES: Allergies  Allergen Reactions  . Other     "new antibiotic pill, caused a rash" unable to recall name of medication.       MEDICATIONS: I have reviewed the patient's current medications.     PAST SURGICAL HISTORY History reviewed. No pertinent past surgical history.  FAMILY HISTORY: No family history on file.  SOCIAL HISTORY:  reports that he quit smoking about 3 weeks ago. His smoking use included Cigarettes. He smoked 0.00 packs per day. He quit smokeless tobacco use about 3 weeks ago. He reports that he does not drink alcohol or use illicit drugs.  PERFORMANCE STATUS: The patient's performance status is 1 - Symptomatic but completely ambulatory  PHYSICAL EXAM: Most Recent Vital Signs: Blood pressure 114/56, pulse 93, temperature 98.2 F (36.8 C), temperature source Oral, resp. rate 20, height 5' 3"  (1.6 m), weight 100 lb (45.36 kg), SpO2 96.00%. General appearance: alert, cooperative, appears stated age and no distress Head: Normocephalic, without obvious abnormality, atraumatic Eyes: negative findings: lids and lashes normal, conjunctivae and sclerae normal, corneas clear and pupils equal, round, reactive to light and accomodation Throat: lips, mucosa, and tongue normal; teeth and gums normal Neck: mild anterior cervical adenopathy  and supple, symmetrical, trachea midline Back: symmetric, no curvature. ROM normal. No CVA tenderness., sebaceous cyst on left paraspinal area of thoracic back Lungs: clear to auscultation bilaterally Heart: regular rate and rhythm Abdomen: normal findings: bowel sounds normal, liver span normal to percussion, no masses palpable, no organomegaly and spleen non-palpable Extremities: extremities normal, atraumatic, no cyanosis or edema Skin: Skin color, texture, turgor normal. No rashes or lesions Lymph nodes:  Cervical adenopathy: noted bilaterally, Axillary adenopathy: noted bilaterally, Supraclavicular adenopathy: noted bilaterally and Inguinal adenopathy: noted Neurologic: Grossly normal  LABORATORY DATA:  Results for orders placed during the hospital encounter of 05/18/13 (from the past 48 hour(s))  CBC WITH DIFFERENTIAL     Status: Abnormal   Collection Time    05/18/13  1:34 PM      Result Value Ref Range   WBC 9.1  4.0 - 10.5 K/uL   RBC 3.67 (*) 4.22 - 5.81 MIL/uL   Hemoglobin 9.6 (*) 13.0 - 17.0 g/dL   HCT 29.6 (*) 39.0 - 52.0 %   MCV 80.7  78.0 - 100.0 fL   MCH 26.2  26.0 - 34.0 pg   MCHC 32.4  30.0 - 36.0 g/dL   RDW 17.5 (*) 11.5 - 15.5 %   Platelets 72 (*) 150 - 400 K/uL   Comment: SPECIMEN CHECKED FOR CLOTS     PLATELET COUNT CONFIRMED BY SMEAR   Neutrophils Relative % 69  43 - 77 %   Neutro Abs 6.3  1.7 - 7.7 K/uL   Lymphocytes Relative 21  12 - 46 %   Lymphs Abs 1.9  0.7 - 4.0 K/uL   Monocytes Relative 9  3 - 12 %   Monocytes Absolute 0.8  0.1 - 1.0 K/uL   Eosinophils Relative 0  0 - 5 %   Eosinophils Absolute 0.0  0.0 - 0.7 K/uL   Basophils Relative 0  0 - 1 %   Basophils Absolute 0.0  0.0 - 0.1 K/uL   WBC Morphology ATYPICAL LYMPHOCYTES     RBC Morphology POLYCHROMASIA PRESENT    BASIC METABOLIC PANEL     Status: Abnormal   Collection Time    05/18/13  1:34 PM      Result Value Ref Range   Sodium 130 (*) 137 - 147 mEq/L   Potassium 4.9  3.7 - 5.3 mEq/L   Chloride 92 (*) 96 - 112 mEq/L   CO2 21  19 - 32 mEq/L   Glucose, Bld 117 (*) 70 - 99 mg/dL   BUN 44 (*) 6 - 23 mg/dL   Creatinine, Ser 3.12 (*) 0.50 - 1.35 mg/dL   Calcium 8.5  8.4 - 10.5 mg/dL   GFR calc non Af Amer 20 (*) >90 mL/min   GFR calc Af Amer 23 (*) >90 mL/min   Comment: (NOTE)     The eGFR has been calculated using the CKD EPI equation.     This calculation has not been validated in all clinical situations.     eGFR's persistently <90 mL/min signify possible Chronic Kidney     Disease.   HEPATIC FUNCTION PANEL     Status: Abnormal   Collection Time    05/18/13  1:34 PM      Result Value Ref Range   Total Protein 8.3  6.0 - 8.3 g/dL   Albumin 2.4 (*) 3.5 - 5.2 g/dL   AST 15  0 - 37 U/L   ALT <5  0 - 53 U/L   Alkaline  Phosphatase 167 (*) 39 - 117 U/L   Total Bilirubin 0.8  0.3 - 1.2 mg/dL   Bilirubin, Direct 0.2  0.0 - 0.3 mg/dL   Indirect Bilirubin 0.6  0.3 - 0.9 mg/dL  COMPREHENSIVE METABOLIC PANEL     Status: Abnormal   Collection Time    05/19/13  5:06 AM      Result Value Ref Range   Sodium 131 (*) 137 - 147 mEq/L   Potassium 4.4  3.7 - 5.3 mEq/L   Chloride 96  96 - 112 mEq/L   CO2 19  19 - 32 mEq/L   Glucose, Bld 104 (*) 70 - 99 mg/dL   BUN 50 (*) 6 - 23 mg/dL   Creatinine, Ser 3.47 (*) 0.50 - 1.35 mg/dL   Calcium 7.8 (*) 8.4 - 10.5 mg/dL   Total Protein 6.5  6.0 - 8.3 g/dL   Albumin 1.8 (*) 3.5 - 5.2 g/dL   AST 12  0 - 37 U/L   ALT <5  0 - 53 U/L   Alkaline Phosphatase 148 (*) 39 - 117 U/L   Total Bilirubin 0.5  0.3 - 1.2 mg/dL   GFR calc non Af Amer 17 (*) >90 mL/min   GFR calc Af Amer 20 (*) >90 mL/min   Comment: (NOTE)     The eGFR has been calculated using the CKD EPI equation.     This calculation has not been validated in all clinical situations.     eGFR's persistently <90 mL/min signify possible Chronic Kidney     Disease.  CBC     Status: Abnormal   Collection Time    05/19/13  5:06 AM      Result Value Ref Range   WBC 7.3  4.0 - 10.5 K/uL   RBC 3.12 (*) 4.22 - 5.81 MIL/uL   Hemoglobin 8.3 (*) 13.0 - 17.0 g/dL   HCT 25.1 (*) 39.0 - 52.0 %   MCV 80.4  78.0 - 100.0 fL   MCH 26.6  26.0 - 34.0 pg   MCHC 33.1  30.0 - 36.0 g/dL   RDW 17.7 (*) 11.5 - 15.5 %   Platelets 51 (*) 150 - 400 K/uL   Comment: SPECIMEN CHECKED FOR CLOTS     PLATELET COUNT CONFIRMED BY SMEAR  DIFFERENTIAL     Status: None   Collection Time    05/19/13  9:53 AM      Result Value Ref Range   Neutrophils Relative % 72  43 - 77 %   Neutro Abs 4.4  1.7 - 7.7 K/uL    Lymphocytes Relative 18  12 - 46 %   Lymphs Abs 1.1  0.7 - 4.0 K/uL   Monocytes Relative 9  3 - 12 %   Monocytes Absolute 0.6  0.1 - 1.0 K/uL   Eosinophils Relative 0  0 - 5 %   Eosinophils Absolute 0.0  0.0 - 0.7 K/uL   Basophils Relative 1  0 - 1 %   Basophils Absolute 0.0  0.0 - 0.1 K/uL  LACTATE DEHYDROGENASE     Status: None   Collection Time    05/19/13  9:53 AM      Result Value Ref Range   LDH 169  94 - 250 U/L  SEDIMENTATION RATE     Status: Abnormal   Collection Time    05/19/13  9:53 AM      Result Value Ref Range   Sed Rate 95 (*) 0 - 16 mm/hr  C-REACTIVE PROTEIN     Status: Abnormal   Collection Time    05/19/13  9:53 AM      Result Value Ref Range   CRP 16.5 (*) <0.60 mg/dL   Comment: Performed at [place] [facility]     Status: Abnormal   Collection Time    05/19/13  9:53 AM      Result Value Ref Range   Uric Acid, Serum 11.9 (*) 4.0 - 7.8 mg/dL      RADIOGRAPHY: Ct Abdomen Pelvis Wo Contrast  05/18/2013   CLINICAL DATA:  3-4 week history of swollen lymph nodes in the neck, axillary regions, and groin  EXAM: CT CHEST WITHOUT CONTRAST; CT ABDOMEN AND PELVIS WITHOUT CONTRAST  TECHNIQUE: Multidetector CT imaging of the chest was performed following the standard protocol without IV contrast.; Multidetector CT imaging of the abdomen and pelvis was performed following the standard protocol without intravenous contrast.  COMPARISON:  DG CHEST 2 VIEW dated 05/18/2013  FINDINGS: CT scan of the chest: The study is quite limited without IV contrast. The cardiac chambers are normal in size. There are small to moderate-sized bilateral pleural effusions layering posteriorly. There are enlarged supraclavicular lymph nodes bilaterally. There is mild soft tissue fullness in the hilar regions but discrete lymph nodes cannot be clearly delineated. There is likely an enlarged lymph node in the lower right paratracheal region measuring at least 1.5 cm in short axis. There are  retrosternal lymph nodes which are poorly defined. One measures 2.3 cm in short axis. There are numerous mildly enlarged axillary lymph nodes. The largest on the right measures 1.3 cm in short axis. At lung window settings there are emphysematous changes bilaterally. There is mild atelectasis versus pneumonia adjacent to the bilateral pleural effusions. No pulmonary parenchymal masses are demonstrated.  The thoracic vertebral bodies are preserved in height. The sternum and ribs exhibit no acute abnormalities.  CT scan of the abdomen and pelvis: The liver is normal in contour. There is mild intrahepatic ductal dilation. There is a subcentimeter hypodensity in the left hepatic lobe on image 60 which exhibits Hounsfield measurement of +6. This likely reflects a cyst. The gallbladder is mildly distended. There is mild gallbladder wall thickening. No stones are evident. The pancreas is grossly normal where visualized. The spleen measures 10.3 cm AP x 9 cm transversely x 9.8 cm in superior to inferior dimension. The caliber of the abdominal aorta is normal. There are in large periaortic and pericaval lymph nodes. These are difficult to separate from adjacent soft tissues. One on image 75 measures 2.1 cm in short axis. The adrenal glands and kidneys are grossly normal. There is increased density within the mesenteric fat, and small amounts of fluid are present in the paracolic gutters with a moderate amount of fluid present within the pelvis. The urinary bladder is partially distended. The prostate gland and seminal vesicles are grossly normal. There is no inguinal hernia. There is no umbilical hernia.  The non-opacified loops of small and large bowel are grossly normal. No inflammatory changes are suspected. There is a paucity of subcutaneous fat diffusely.  The lumbar vertebral bodies are preserved in height. There are no lytic or blastic bony lesions of the lumbar spine nor of the pelvis.  IMPRESSION: 1. There are  enlarged lymph nodes within the supraclavicular regions, mediastinum, hilar regions, and axillary regions within the thorax. Within the abdomen and pelvis there are enlarged periaortic and pericaval lymph nodes. This is worrisome for a  lymphoproliferative disorder such as lymphoma. 2. There are small to moderate-sized bilateral pleural effusions. There is a small to moderate amount of ascites. 3. 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 No pulmonary parenchymal masses are demonstrated. Atelectasis or pneumonia is present in both lower lobes adjacent to the pleural fluid collections. There is no evidence of CHF. 4. There is mild intrahepatic ductal dilation of uncertain etiology. The gallbladder exhibits mild wall thickening without evidence of stones. As best as can be determined no acute pancreatic lesion is demonstrated. There is no splenomegaly. No acute abnormality of the kidneys is demonstrated. No acute abnormality of the visualized skeletal structures is demonstrated. 5. The sensitivity of the study is limited overall by the lack of IV and oral contrast. 6. These results were called by telephone at the time of interpretation on 05/18/2013 at 6:07 PM to Dr. Milton Ferguson , who verbally acknowledged these results.   Electronically Signed   By: David  Martinique   On: 05/18/2013 18:11   Dg Chest 2 View  05/18/2013   CLINICAL DATA:  Left adenopathy.  EXAM: CHEST  2 VIEW  COMPARISON:  DG CHEST 2 VIEW dated 04/23/2013; CT ABD - PELV W/ CM dated 11/18/2012  FINDINGS: Mild fullness in the mediastinum noted. Mediastinal adenopathy or mass age cannot be excluded. Borderline cardiomegaly, pulmonary vascularity is normal. Bibasilar pulmonary infiltrates consistent with pneumonia noted. Associated atelectasis. Associated left pleural effusion present.  IMPRESSION: 1. Mediastinal fullness, adenopathy cannot be excluded. 2. Bibasilar prominent pulmonary infiltrates with associated atelectasis. These infiltrates  most consistent with pneumonia. 3. Left pleural effusion.   Electronically Signed   By: Marcello Moores  Register   On: 05/18/2013 15:49   Ct Chest Wo Contrast  05/18/2013   CLINICAL DATA:  3-4 week history of swollen lymph nodes in the neck, axillary regions, and groin  EXAM: CT CHEST WITHOUT CONTRAST; CT ABDOMEN AND PELVIS WITHOUT CONTRAST  TECHNIQUE: Multidetector CT imaging of the chest was performed following the standard protocol without IV contrast.; Multidetector CT imaging of the abdomen and pelvis was performed following the standard protocol without intravenous contrast.  COMPARISON:  DG CHEST 2 VIEW dated 05/18/2013  FINDINGS: CT scan of the chest: The study is quite limited without IV contrast. The cardiac chambers are normal in size. There are small to moderate-sized bilateral pleural effusions layering posteriorly. There are enlarged supraclavicular lymph nodes bilaterally. There is mild soft tissue fullness in the hilar regions but discrete lymph nodes cannot be clearly delineated. There is likely an enlarged lymph node in the lower right paratracheal region measuring at least 1.5 cm in short axis. There are retrosternal lymph nodes which are poorly defined. One measures 2.3 cm in short axis. There are numerous mildly enlarged axillary lymph nodes. The largest on the right measures 1.3 cm in short axis. At lung window settings there are emphysematous changes bilaterally. There is mild atelectasis versus pneumonia adjacent to the bilateral pleural effusions. No pulmonary parenchymal masses are demonstrated.  The thoracic vertebral bodies are preserved in height. The sternum and ribs exhibit no acute abnormalities.  CT scan of the abdomen and pelvis: The liver is normal in contour. There is mild intrahepatic ductal dilation. There is a subcentimeter hypodensity in the left hepatic lobe on image 60 which exhibits Hounsfield measurement of +6. This likely reflects a cyst. The gallbladder is mildly distended.  There is mild gallbladder wall thickening. No stones are evident. The pancreas is grossly normal where visualized. The spleen measures 10.3 cm  AP x 9 cm transversely x 9.8 cm in superior to inferior dimension. The caliber of the abdominal aorta is normal. There are in large periaortic and pericaval lymph nodes. These are difficult to separate from adjacent soft tissues. One on image 75 measures 2.1 cm in short axis. The adrenal glands and kidneys are grossly normal. There is increased density within the mesenteric fat, and small amounts of fluid are present in the paracolic gutters with a moderate amount of fluid present within the pelvis. The urinary bladder is partially distended. The prostate gland and seminal vesicles are grossly normal. There is no inguinal hernia. There is no umbilical hernia.  The non-opacified loops of small and large bowel are grossly normal. No inflammatory changes are suspected. There is a paucity of subcutaneous fat diffusely.  The lumbar vertebral bodies are preserved in height. There are no lytic or blastic bony lesions of the lumbar spine nor of the pelvis.  IMPRESSION: 1. There are enlarged lymph nodes within the supraclavicular regions, mediastinum, hilar regions, and axillary regions within the thorax. Within the abdomen and pelvis there are enlarged periaortic and pericaval lymph nodes. This is worrisome for a lymphoproliferative disorder such as lymphoma. 2. There are small to moderate-sized bilateral pleural effusions. There is a small to moderate amount of ascites. 3. 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 No pulmonary parenchymal masses are demonstrated. Atelectasis or pneumonia is present in both lower lobes adjacent to the pleural fluid collections. There is no evidence of CHF. 4. There is mild intrahepatic ductal dilation of uncertain etiology. The gallbladder exhibits mild wall thickening without evidence of stones. As best as can be determined no acute  pancreatic lesion is demonstrated. There is no splenomegaly. No acute abnormality of the kidneys is demonstrated. No acute abnormality of the visualized skeletal structures is demonstrated. 5. The sensitivity of the study is limited overall by the lack of IV and oral contrast. 6. These results were called by telephone at the time of interpretation on 05/18/2013 at 6:07 PM to Dr. Milton Ferguson , who verbally acknowledged these results.   Electronically Signed   By: David  Martinique   On: 05/18/2013 18:11   US Renal  05/19/2013   CLINICAL DATA:  Acute renal failure, question obstruction secondary to lymphoma  EXAM: RENAL/URINARY TRACT ULTRASOUND COMPLETE  COMPARISON:  CT abdomen and pelvis 05/19/2006  FINDINGS: Right Kidney:  Length: 11.4 cm. Normal cortical thickness. Upper normal cortical echogenicity. No mass, hydronephrosis or shadowing calcification.  Left Kidney:  Length: 11.0 cm. Normal cortical thickness. Upper normal cortical echogenicity. Small cyst at lower pole 2.1 x 1.6 x 1.8 cm, simple features. No solid mass, hydronephrosis shadowing calcification.  Bladder:  Normally distended.  Ureteral jets were not visualized.  BILATERAL pleural effusions noted.  Small amounts of ascites noted in pelvis and at Morison's pouch.  IMPRESSION: No evidence of urinary tract obstruction.  Simple cyst inferior pole LEFT kidney 2.1 cm greatest size.  Small BILATERAL pleural effusions and minimal ascites.   Electronically Signed   By: Lavonia Dana M.D.   On: 05/19/2013 08:53       PATHOLOGY:  None   ASSESSMENT:  1. Diffuse lymphadenopathy suspicious for lymphoma, may represent a lymphoproliferative disorder associated with rheumatoid arthritis or methotrexate therapy.  If the latter is suspected from histology, discontinuation of methotrexate may reverse the abnormality. 2. B symptoms including drenching night sweats, unintentional weight loss, and decreased appetite 3. Nausea with vomiting 4. Normocytic, normochromic  anemia with  elevated RDW 5. Thrombocytopenia, ?secondary to methotrexate and plaquenil or on an immune basis. 6. Renal failure 7. Malnutrition 8. Elevated uric acid level 9. Elevated CRP and ESR 10. Rheumatoid arthritis, on methotrexate and plaquenil.  Patient Active Problem List   Diagnosis Date Noted  . Fever 05/19/2013  . Night sweats 05/19/2013  . Protein-calorie malnutrition, severe 05/19/2013  . Rheumatoid arthritis 05/19/2013  . Lymphadenopathy 05/18/2013  . ARF (acute renal failure) 05/18/2013  . HTN (hypertension) 05/18/2013     PLAN:  1. I personally reviewed and went over laboratory results with the patient.  The results are noted within this dictation. 2. I personally reviewed and went over radiographic studies with the patient.  The results are noted within this dictation.   3. Chart reviewed 4. Patient education regarding lymphadenopathy 5. Labs ordered: Uric acid, SPEP and IFE, Light chain assay, B2M, CRP, ESR, LDH, EBV 6. Nausea regimen re-vamped: Reglan 10 mg TID scheduled, Zofran 8 mg PO scheduled, Compazine 10 mg PO every 6 hours PRN 7. Recommend excisional lymph node biopsy.  Renal function requires improvement to safely administer general anesthesia.  Recommend tissue be send for FISH, FLOW, Cytogenetics, and gene rearrangement studies. 8. Will order irradiated platelets for Friday, as surgeon plans on Friday procedure.  Will order an additional unit for hold. 9. Will follow along while an inpatient and assist in patient care as needed.  All questions were answered. The patient knows to call the clinic with any problems, questions or concerns. We can certainly see the patient much sooner if necessary.  Patient and plan discussed with Dr. Farrel Gobble and he is in agreement with the aforementioned.    [facility] 05/19/2013

## 2013-05-19 NOTE — Care Management Utilization Note (Signed)
UR completed 

## 2013-05-20 DIAGNOSIS — E43 Unspecified severe protein-calorie malnutrition: Secondary | ICD-10-CM

## 2013-05-20 DIAGNOSIS — K5900 Constipation, unspecified: Secondary | ICD-10-CM

## 2013-05-20 LAB — CBC
HCT: 24.6 % — ABNORMAL LOW (ref 39.0–52.0)
HEMOGLOBIN: 8 g/dL — AB (ref 13.0–17.0)
MCH: 26.6 pg (ref 26.0–34.0)
MCHC: 32.5 g/dL (ref 30.0–36.0)
MCV: 81.7 fL (ref 78.0–100.0)
Platelets: 92 10*3/uL — ABNORMAL LOW (ref 150–400)
RBC: 3.01 MIL/uL — ABNORMAL LOW (ref 4.22–5.81)
RDW: 17.6 % — AB (ref 11.5–15.5)
WBC: 7.9 10*3/uL (ref 4.0–10.5)

## 2013-05-20 LAB — BASIC METABOLIC PANEL
BUN: 48 mg/dL — AB (ref 6–23)
CHLORIDE: 100 meq/L (ref 96–112)
CO2: 19 mEq/L (ref 19–32)
CREATININE: 2.56 mg/dL — AB (ref 0.50–1.35)
Calcium: 7.7 mg/dL — ABNORMAL LOW (ref 8.4–10.5)
GFR calc Af Amer: 29 mL/min — ABNORMAL LOW (ref >90)
GFR calc non Af Amer: 25 mL/min — ABNORMAL LOW (ref >90)
GLUCOSE: 130 mg/dL — AB (ref 70–99)
POTASSIUM: 5 meq/L (ref 3.7–5.3)
Sodium: 133 mEq/L — ABNORMAL LOW (ref 137–147)

## 2013-05-20 LAB — EPSTEIN-BARR VIRUS VCA ANTIBODY PANEL
EBV EA IgG: 12.6 U/mL — ABNORMAL HIGH (ref <9.0)
EBV NA IgG: 337 U/mL — ABNORMAL HIGH (ref <18.0)
EBV VCA IgG: 743 U/mL — ABNORMAL HIGH (ref <18.0)
EBV VCA IgM: 10.4 U/mL (ref <36.0)

## 2013-05-20 MED ORDER — MAGNESIUM HYDROXIDE 400 MG/5ML PO SUSP
30.0000 mL | Freq: Once | ORAL | Status: AC
  Administered 2013-05-20: 30 mL via ORAL
  Filled 2013-05-20: qty 30

## 2013-05-20 MED ORDER — GABAPENTIN 300 MG PO CAPS
300.0000 mg | ORAL_CAPSULE | Freq: Every day | ORAL | Status: DC
  Administered 2013-05-21 – 2013-05-23 (×3): 300 mg via ORAL
  Filled 2013-05-20 (×3): qty 1

## 2013-05-20 MED ORDER — DOCUSATE SODIUM 100 MG PO CAPS
100.0000 mg | ORAL_CAPSULE | Freq: Two times a day (BID) | ORAL | Status: DC
  Administered 2013-05-20 – 2013-05-23 (×7): 100 mg via ORAL
  Filled 2013-05-20 (×7): qty 1

## 2013-05-20 MED ORDER — LEVOFLOXACIN 500 MG PO TABS
500.0000 mg | ORAL_TABLET | ORAL | Status: DC
  Administered 2013-05-20: 500 mg via ORAL
  Filled 2013-05-20: qty 1

## 2013-05-20 MED ORDER — POLYETHYLENE GLYCOL 3350 17 G PO PACK
17.0000 g | PACK | Freq: Two times a day (BID) | ORAL | Status: DC
  Administered 2013-05-20 – 2013-05-21 (×4): 17 g via ORAL
  Filled 2013-05-20 (×4): qty 1

## 2013-05-20 MED ORDER — BIOTENE DRY MOUTH MT LIQD
15.0000 mL | Freq: Two times a day (BID) | OROMUCOSAL | Status: DC
  Administered 2013-05-20 – 2013-05-23 (×7): 15 mL via OROMUCOSAL

## 2013-05-20 NOTE — Progress Notes (Signed)
Patient was on 4LNC with 02 saturations at 85%, placed patient on a 40% venturi mask sats now reading 93%. RT will continue to monitor

## 2013-05-20 NOTE — Progress Notes (Signed)
PHARMACIST - PHYSICIAN COMMUNICATION DR:   [place] CONCERNING: Antibiotic IV to Oral Route Change Policy  RECOMMENDATION: This patient is receiving Levaquin by the intravenous route.  Based on criteria approved by the Pharmacy and Therapeutics Committee, the antibiotic(s) is/are being converted to the equivalent oral dose form(s).   DESCRIPTION: These criteria include:  Patient being treated for a respiratory tract infection, urinary tract infection, cellulitis or clostridium difficile associated diarrhea if on metronidazole  The patient is not neutropenic and does not exhibit a GI malabsorption state  The patient is eating (either orally or via tube) and/or has been taking other orally administered medications for a least 24 hours  The patient is improving clinically and has a Tmax < 100.5  If you have questions about this conversion, please contact the Pharmacy Department  [x]  ( [phone] )  [place] []  ( [phone]  [place]  []  ( [phone] )  [facility] []  ( [phone] )  [facility]   S. Salvador Bigbee, PharmD 

## 2013-05-20 NOTE — Progress Notes (Signed)
Patient was sating in the 70's when the NT went in to take his vitals. Turned patients O2 up to 4L via [place], and paged RT.  Patients O2 came up to 90, RT is going to come see patient. Will continue to monitor patient.

## 2013-05-20 NOTE — Progress Notes (Signed)
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 Admitted for further evaluation of diffuse lymphadenopathy concerning for lymphoma.  Assessment/Plan: 1. Diffuse lymphadenopathy, supraclavicular, mediastinal, hilar, axillary, periaortic, pericaval suspicious for lymphoma. 2. Acute renal failure secondary to dehydration. Much improved today. Not clear that urine output has been fully report. Renal ultrasound unremarkable. 3. Possible pneumonia. Suspect atelectasis, remains afebrile. 4. Nausea, vomiting. Much improved. 5. Normocytic, normochromic anemia. Stable. 6. Thrombocytopenia. Improved today. Possibly related to methotrexate or Plaquenil. 7. Hypertension. Stable. 8. Constipation. 9. Severe malnutrition in context of chronic illness, underweight. 10. History of rheumatoid arthritis. Maintained on methotrexate and Plaquenil. 11. Tobacco dependence in remission. Mild intrahepatic ductal: Station of uncertain etiology.   General surgery plans axillary lymph node biopsy 5/1. Studies to be ordered as recommended by oncology  Strict I/O, daily weights  Wean oxygen  Continue antibiotics although pneumonia doubted.  CBC, BMP in the morning.  Bowel regimen.  Code Status: full code DVT prophylaxis: SCDs (thrombocytopenia) Family Communication: none present Disposition Plan: home when improved  Murray Hodgkins, MD  [facility]  Pager [phone] If 7PM-7AM, please contact night-coverage at [email or web address], password [facility] 05/20/2013, 2:20 PM  LOS: 2 days   Consultants:  General surgery  Oncology  Procedures:    Antibiotics:  Levaquin 4/27 >>   HPI/Subjective: Feels ok. No nausea or vomiting. Reports poor appetite. Constipated for 2  days. Bloated.  Objective: Filed Vitals:   05/20/13 0357 05/20/13 0515 05/20/13 0638 05/20/13 1336  BP: 120/74 126/70 112/71 144/76  Pulse: 99 97 96 94  Temp: 97.4 F (36.3 C) 97.3 F (36.3 C) 97.3 F (36.3 C) 97.4 F (36.3 C)  TempSrc: Oral Oral Oral Oral  Resp:  16 16 20   Height:      Weight:      SpO2: 93% 93% 96% 95%    Intake/Output Summary (Last 24 hours) at 05/20/13 1420 Last data filed at 05/20/13 1300  Gross per 24 hour  Intake  937.5 ml  Output    400 ml  Net  537.5 ml     Filed Weights   05/18/13 1112  Weight: 45.36 kg (100 lb)    Exam:   Afebrile, vital signs are stable.   Gen. Appears calm and comfortable. Speech is fluent and clear. Psychiatric. Grossly normal mood and affect. Speech fluent and appropriate.  Cardiovascular. Regular rate and rhythm. No murmur, rub or gallop. No lower trauma to edema.  Respiratory clear to auscultation bilaterally. No wheezes, rales or rhonchi. Normal respiratory effort.  Abdomen distended, soft, nontender.  Data Reviewed:  Urine output recorded as 300, not clear fully measured.  Renal ultrasound unremarkable.  Creatinine 3.47 >> 2.56.  Hemoglobin sibling 8.0. Platelet count improved, 92.   EKG normal sinus rhythm. Cannot Ross septal infarct, age unknown.  Scheduled Meds: . [START ON 05/22/2013] sodium chloride  250 mL Intravenous Once  . [START ON 05/22/2013] acetaminophen  650 mg Oral Once  . albuterol  2.5 mg Nebulization BID  . amLODipine  10 mg Oral Daily  . antiseptic oral rinse  15 mL Mouth Rinse BID  . budesonide-formoterol  2 puff Inhalation BID  . cholecalciferol  1,000 Units Oral Daily  . [START ON 05/22/2013] diphenhydrAMINE  25 mg Oral Once  . feeding supplement (ENSURE COMPLETE)  237 mL Oral BID  BM  . folic acid  1 mg Oral Daily  . gabapentin  300 mg Oral BID  . hydroxychloroquine  200 mg Oral Q supper  . levofloxacin  500 mg Oral Q48H  . metoCLOPramide  10 mg Oral TID AC  . omega-3 acid  ethyl esters  2 g Oral Daily  . ondansetron  8 mg Oral 3 times per day  . pantoprazole  40 mg Oral Daily  . traMADol  50 mg Oral Q12H   Continuous Infusions: . sodium chloride 150 mL/hr at 05/20/13 6387    Principal Problem:   Lymphadenopathy Active Problems:   ARF (acute renal failure)   HTN (hypertension)   Fever   Night sweats   Protein-calorie malnutrition, severe   Rheumatoid arthritis   Time spent 25  minutes

## 2013-05-20 NOTE — Consult Note (Signed)
Reason for Consult: Request for lymph node biopsy Referring Physician: Hospitalist, oncology  Gabriel Davenport is an 63 y.o. male.  HPI: Patient is a 63 year old black male who was admitted with the diagnosis of lymphadenopathy as well as other multiple medical problems. I have been requested to do a lymph node biopsy to assess for lymphoma.  Past Medical History  Diagnosis Date  . Arthritis   . Hypertension   . Stomach problems   . Gastroenteritis   . Rheumatoid arthritis 05/19/2013    History reviewed. No pertinent past surgical history.  No family history on file.  Social History:  reports that he quit smoking about 3 weeks ago. His smoking use included Cigarettes. He smoked 0.00 packs per day. He quit smokeless tobacco use about 3 weeks ago. He reports that he does not drink alcohol or use illicit drugs.  Allergies:  Allergies  Allergen Reactions  . Other     "new antibiotic pill, caused a rash" unable to recall name of medication.     Medications: I have reviewed the patient's current medications.  Results for orders placed during the hospital encounter of 05/18/13 (from the past 48 hour(s))  CBC WITH DIFFERENTIAL     Status: Abnormal   Collection Time    05/18/13  1:34 PM      Result Value Ref Range   WBC 9.1  4.0 - 10.5 K/uL   RBC 3.67 (*) 4.22 - 5.81 MIL/uL   Hemoglobin 9.6 (*) 13.0 - 17.0 g/dL   HCT 29.6 (*) 39.0 - 52.0 %   MCV 80.7  78.0 - 100.0 fL   MCH 26.2  26.0 - 34.0 pg   MCHC 32.4  30.0 - 36.0 g/dL   RDW 17.5 (*) 11.5 - 15.5 %   Platelets 72 (*) 150 - 400 K/uL   Comment: SPECIMEN CHECKED FOR CLOTS     PLATELET COUNT CONFIRMED BY SMEAR   Neutrophils Relative % 69  43 - 77 %   Neutro Abs 6.3  1.7 - 7.7 K/uL   Lymphocytes Relative 21  12 - 46 %   Lymphs Abs 1.9  0.7 - 4.0 K/uL   Monocytes Relative 9  3 - 12 %   Monocytes Absolute 0.8  0.1 - 1.0 K/uL   Eosinophils Relative 0  0 - 5 %   Eosinophils Absolute 0.0  0.0 - 0.7 K/uL   Basophils Relative 0  0  - 1 %   Basophils Absolute 0.0  0.0 - 0.1 K/uL   WBC Morphology ATYPICAL LYMPHOCYTES     RBC Morphology POLYCHROMASIA PRESENT    BASIC METABOLIC PANEL     Status: Abnormal   Collection Time    05/18/13  1:34 PM      Result Value Ref Range   Sodium 130 (*) 137 - 147 mEq/L   Potassium 4.9  3.7 - 5.3 mEq/L   Chloride 92 (*) 96 - 112 mEq/L   CO2 21  19 - 32 mEq/L   Glucose, Bld 117 (*) 70 - 99 mg/dL   BUN 44 (*) 6 - 23 mg/dL   Creatinine, Ser 3.12 (*) 0.50 - 1.35 mg/dL   Calcium 8.5  8.4 - 10.5 mg/dL   GFR calc non Af Amer 20 (*) >90 mL/min   GFR calc Af Amer 23 (*) >90 mL/min   Comment: (NOTE)     The eGFR has been calculated using the CKD EPI equation.     This calculation has not been validated in  all clinical situations.     eGFR's persistently <90 mL/min signify possible Chronic Kidney     Disease.  HEPATIC FUNCTION PANEL     Status: Abnormal   Collection Time    05/18/13  1:34 PM      Result Value Ref Range   Total Protein 8.3  6.0 - 8.3 g/dL   Albumin 2.4 (*) 3.5 - 5.2 g/dL   AST 15  0 - 37 U/L   ALT <5  0 - 53 U/L   Alkaline Phosphatase 167 (*) 39 - 117 U/L   Total Bilirubin 0.8  0.3 - 1.2 mg/dL   Bilirubin, Direct 0.2  0.0 - 0.3 mg/dL   Indirect Bilirubin 0.6  0.3 - 0.9 mg/dL  COMPREHENSIVE METABOLIC PANEL     Status: Abnormal   Collection Time    05/19/13  5:06 AM      Result Value Ref Range   Sodium 131 (*) 137 - 147 mEq/L   Potassium 4.4  3.7 - 5.3 mEq/L   Chloride 96  96 - 112 mEq/L   CO2 19  19 - 32 mEq/L   Glucose, Bld 104 (*) 70 - 99 mg/dL   BUN 50 (*) 6 - 23 mg/dL   Creatinine, Ser 3.47 (*) 0.50 - 1.35 mg/dL   Calcium 7.8 (*) 8.4 - 10.5 mg/dL   Total Protein 6.5  6.0 - 8.3 g/dL   Albumin 1.8 (*) 3.5 - 5.2 g/dL   AST 12  0 - 37 U/L   ALT <5  0 - 53 U/L   Alkaline Phosphatase 148 (*) 39 - 117 U/L   Total Bilirubin 0.5  0.3 - 1.2 mg/dL   GFR calc non Af Amer 17 (*) >90 mL/min   GFR calc Af Amer 20 (*) >90 mL/min   Comment: (NOTE)     The eGFR has been  calculated using the CKD EPI equation.     This calculation has not been validated in all clinical situations.     eGFR's persistently <90 mL/min signify possible Chronic Kidney     Disease.  CBC     Status: Abnormal   Collection Time    05/19/13  5:06 AM      Result Value Ref Range   WBC 7.3  4.0 - 10.5 K/uL   RBC 3.12 (*) 4.22 - 5.81 MIL/uL   Hemoglobin 8.3 (*) 13.0 - 17.0 g/dL   HCT 25.1 (*) 39.0 - 52.0 %   MCV 80.4  78.0 - 100.0 fL   MCH 26.6  26.0 - 34.0 pg   MCHC 33.1  30.0 - 36.0 g/dL   RDW 17.7 (*) 11.5 - 15.5 %   Platelets 51 (*) 150 - 400 K/uL   Comment: SPECIMEN CHECKED FOR CLOTS     PLATELET COUNT CONFIRMED BY SMEAR  DIFFERENTIAL     Status: None   Collection Time    05/19/13  9:53 AM      Result Value Ref Range   Neutrophils Relative % 72  43 - 77 %   Neutro Abs 4.4  1.7 - 7.7 K/uL   Lymphocytes Relative 18  12 - 46 %   Lymphs Abs 1.1  0.7 - 4.0 K/uL   Monocytes Relative 9  3 - 12 %   Monocytes Absolute 0.6  0.1 - 1.0 K/uL   Eosinophils Relative 0  0 - 5 %   Eosinophils Absolute 0.0  0.0 - 0.7 K/uL   Basophils Relative 1  0 -  1 %   Basophils Absolute 0.0  0.0 - 0.1 K/uL  LACTATE DEHYDROGENASE     Status: None   Collection Time    05/19/13  9:53 AM      Result Value Ref Range   LDH 169  94 - 250 U/L  SEDIMENTATION RATE     Status: Abnormal   Collection Time    05/19/13  9:53 AM      Result Value Ref Range   Sed Rate 95 (*) 0 - 16 mm/hr  C-REACTIVE PROTEIN     Status: Abnormal   Collection Time    05/19/13  9:53 AM      Result Value Ref Range   CRP 16.5 (*) <0.60 mg/dL   Comment: Performed at [facility]     Status: Abnormal   Collection Time    05/19/13  9:53 AM      Result Value Ref Range   Uric Acid, Serum 11.9 (*) 4.0 - 7.8 mg/dL  EPSTEIN-BARR VIRUS VCA ANTIBODY PANEL     Status: Abnormal   Collection Time    05/19/13 11:03 AM      Result Value Ref Range   EBV VCA IgG 743.0 (*) <18.0 U/mL   Comment: (NOTE)     Reference  Range:       <18.0 U/mL = Negative                       18.0-21.9 U/mL = Equivocal                          >=22.0 U/mL = Positive   EBV VCA IgM 10.4  <36.0 U/mL   Comment: (NOTE)     Reference Range:       <36.0 U/mL = Negative                       36.0-43.9 U/mL = Equivocal                          >=44.0 U/mL = Positive   EBV NA IgG 337.0 (*) <18.0 U/mL   Comment: (NOTE)     Reference Range:       <18.0 U/mL = Negative                       18.0-21.9 U/mL = Equivocal                          >=22.0 U/mL = Positive           Clinical Stage            VCA IgG   VCA IgM      EA    EBV NA           Susceptibility               -         -         -       -           Very Early Infection        +/-       +/-        -       -  Established Infection        +         +        +/-      -           Recent Infection             +         +        +/-     +/-           Past Infection               +         -        +/-      +                                                                                     +/- means positive or negative (not weak)     High persisting antibody levels may be present in Burkitt's lymphoma     and nasopharyngeal carcinoma.   EBV EA IgG 12.6 (*) <9.0 U/mL   Comment: (NOTE)     Reference Range:        <9.0 U/mL = Negative                        9.0-10.9 U/mL = Equivocal                          >=11.0 U/mL = Positive     Assay cross-reactivity for Early Antigen (EA) has been noted with some     specimens containing antibody to Human Immunodeficiency Virus (HIV).     HIV disease must be excluded before confirmation of EBV diagnosis.       Performed at [facility]     Status: None   Collection Time    05/19/13  8:15 PM      Result Value Ref Range   Unit Number [id number]     Blood Component Type PLTPHER LI3     Unit division 00     Status of Unit ISSUED     Transfusion Status OK TO TRANSFUSE    TYPE AND  SCREEN     Status: None   Collection Time    05/19/13  8:15 PM      Result Value Ref Range   ABO/RH(D) A POS     Antibody Screen NEG     Sample Expiration 17/91/5056    BASIC METABOLIC PANEL     Status: Abnormal   Collection Time    05/20/13  5:31 AM      Result Value Ref Range   Sodium 133 (*) 137 - 147 mEq/L   Potassium 5.0  3.7 - 5.3 mEq/L   Chloride 100  96 - 112 mEq/L   CO2 19  19 - 32 mEq/L   Glucose, Bld 130 (*) 70 - 99 mg/dL   BUN 48 (*) 6 - 23 mg/dL   Creatinine, Ser 2.56 (*) 0.50 - 1.35 mg/dL   Calcium  7.7 (*) 8.4 - 10.5 mg/dL   GFR calc non Af Amer 25 (*) >90 mL/min   GFR calc Af Amer 29 (*) >90 mL/min   Comment: (NOTE)     The eGFR has been calculated using the CKD EPI equation.     This calculation has not been validated in all clinical situations.     eGFR's persistently <90 mL/min signify possible Chronic Kidney     Disease.  CBC     Status: Abnormal   Collection Time    05/20/13  5:31 AM      Result Value Ref Range   WBC 7.9  4.0 - 10.5 K/uL   RBC 3.01 (*) 4.22 - 5.81 MIL/uL   Hemoglobin 8.0 (*) 13.0 - 17.0 g/dL   HCT 24.6 (*) 39.0 - 52.0 %   MCV 81.7  78.0 - 100.0 fL   MCH 26.6  26.0 - 34.0 pg   MCHC 32.5  30.0 - 36.0 g/dL   RDW 17.6 (*) 11.5 - 15.5 %   Platelets 92 (*) 150 - 400 K/uL   Comment: SPECIMEN CHECKED FOR CLOTS     PLATELET COUNT CONFIRMED BY SMEAR    Ct Abdomen Pelvis Wo Contrast  05/18/2013   CLINICAL DATA:  3-4 week history of swollen lymph nodes in the neck, axillary regions, and groin  EXAM: CT CHEST WITHOUT CONTRAST; CT ABDOMEN AND PELVIS WITHOUT CONTRAST  TECHNIQUE: Multidetector CT imaging of the chest was performed following the standard protocol without IV contrast.; Multidetector CT imaging of the abdomen and pelvis was performed following the standard protocol without intravenous contrast.  COMPARISON:  DG CHEST 2 VIEW dated 05/18/2013  FINDINGS: CT scan of the chest: The study is quite limited without IV contrast. The cardiac  chambers are normal in size. There are small to moderate-sized bilateral pleural effusions layering posteriorly. There are enlarged supraclavicular lymph nodes bilaterally. There is mild soft tissue fullness in the hilar regions but discrete lymph nodes cannot be clearly delineated. There is likely an enlarged lymph node in the lower right paratracheal region measuring at least 1.5 cm in short axis. There are retrosternal lymph nodes which are poorly defined. One measures 2.3 cm in short axis. There are numerous mildly enlarged axillary lymph nodes. The largest on the right measures 1.3 cm in short axis. At lung window settings there are emphysematous changes bilaterally. There is mild atelectasis versus pneumonia adjacent to the bilateral pleural effusions. No pulmonary parenchymal masses are demonstrated.  The thoracic vertebral bodies are preserved in height. The sternum and ribs exhibit no acute abnormalities.  CT scan of the abdomen and pelvis: The liver is normal in contour. There is mild intrahepatic ductal dilation. There is a subcentimeter hypodensity in the left hepatic lobe on image 60 which exhibits Hounsfield measurement of +6. This likely reflects a cyst. The gallbladder is mildly distended. There is mild gallbladder wall thickening. No stones are evident. The pancreas is grossly normal where visualized. The spleen measures 10.3 cm AP x 9 cm transversely x 9.8 cm in superior to inferior dimension. The caliber of the abdominal aorta is normal. There are in large periaortic and pericaval lymph nodes. These are difficult to separate from adjacent soft tissues. One on image 75 measures 2.1 cm in short axis. The adrenal glands and kidneys are grossly normal. There is increased density within the mesenteric fat, and small amounts of fluid are present in the paracolic gutters with a moderate amount of fluid present within the pelvis.  The urinary bladder is partially distended. The prostate gland and seminal  vesicles are grossly normal. There is no inguinal hernia. There is no umbilical hernia.  The non-opacified loops of small and large bowel are grossly normal. No inflammatory changes are suspected. There is a paucity of subcutaneous fat diffusely.  The lumbar vertebral bodies are preserved in height. There are no lytic or blastic bony lesions of the lumbar spine nor of the pelvis.  IMPRESSION: 1. There are enlarged lymph nodes within the supraclavicular regions, mediastinum, hilar regions, and axillary regions within the thorax. Within the abdomen and pelvis there are enlarged periaortic and pericaval lymph nodes. This is worrisome for a lymphoproliferative disorder such as lymphoma. 2. There are small to moderate-sized bilateral pleural effusions. There is a small to moderate amount of ascites. 3. 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 No pulmonary parenchymal masses are demonstrated. Atelectasis or pneumonia is present in both lower lobes adjacent to the pleural fluid collections. There is no evidence of CHF. 4. There is mild intrahepatic ductal dilation of uncertain etiology. The gallbladder exhibits mild wall thickening without evidence of stones. As best as can be determined no acute pancreatic lesion is demonstrated. There is no splenomegaly. No acute abnormality of the kidneys is demonstrated. No acute abnormality of the visualized skeletal structures is demonstrated. 5. The sensitivity of the study is limited overall by the lack of IV and oral contrast. 6. These results were called by telephone at the time of interpretation on 05/18/2013 at 6:07 PM to Dr. Milton Ferguson , who verbally acknowledged these results.   Electronically Signed   By: David  Martinique   On: 05/18/2013 18:11   Dg Chest 2 View  05/18/2013   CLINICAL DATA:  Left adenopathy.  EXAM: CHEST  2 VIEW  COMPARISON:  DG CHEST 2 VIEW dated 04/23/2013; CT ABD - PELV W/ CM dated 11/18/2012  FINDINGS: Mild fullness in the mediastinum  noted. Mediastinal adenopathy or mass age cannot be excluded. Borderline cardiomegaly, pulmonary vascularity is normal. Bibasilar pulmonary infiltrates consistent with pneumonia noted. Associated atelectasis. Associated left pleural effusion present.  IMPRESSION: 1. Mediastinal fullness, adenopathy cannot be excluded. 2. Bibasilar prominent pulmonary infiltrates with associated atelectasis. These infiltrates most consistent with pneumonia. 3. Left pleural effusion.   Electronically Signed   By: Marcello Moores  Register   On: 05/18/2013 15:49   Ct Chest Wo Contrast  05/18/2013   CLINICAL DATA:  3-4 week history of swollen lymph nodes in the neck, axillary regions, and groin  EXAM: CT CHEST WITHOUT CONTRAST; CT ABDOMEN AND PELVIS WITHOUT CONTRAST  TECHNIQUE: Multidetector CT imaging of the chest was performed following the standard protocol without IV contrast.; Multidetector CT imaging of the abdomen and pelvis was performed following the standard protocol without intravenous contrast.  COMPARISON:  DG CHEST 2 VIEW dated 05/18/2013  FINDINGS: CT scan of the chest: The study is quite limited without IV contrast. The cardiac chambers are normal in size. There are small to moderate-sized bilateral pleural effusions layering posteriorly. There are enlarged supraclavicular lymph nodes bilaterally. There is mild soft tissue fullness in the hilar regions but discrete lymph nodes cannot be clearly delineated. There is likely an enlarged lymph node in the lower right paratracheal region measuring at least 1.5 cm in short axis. There are retrosternal lymph nodes which are poorly defined. One measures 2.3 cm in short axis. There are numerous mildly enlarged axillary lymph nodes. The largest on the right measures 1.3 cm in short axis. At  lung window settings there are emphysematous changes bilaterally. There is mild atelectasis versus pneumonia adjacent to the bilateral pleural effusions. No pulmonary parenchymal masses are  demonstrated.  The thoracic vertebral bodies are preserved in height. The sternum and ribs exhibit no acute abnormalities.  CT scan of the abdomen and pelvis: The liver is normal in contour. There is mild intrahepatic ductal dilation. There is a subcentimeter hypodensity in the left hepatic lobe on image 60 which exhibits Hounsfield measurement of +6. This likely reflects a cyst. The gallbladder is mildly distended. There is mild gallbladder wall thickening. No stones are evident. The pancreas is grossly normal where visualized. The spleen measures 10.3 cm AP x 9 cm transversely x 9.8 cm in superior to inferior dimension. The caliber of the abdominal aorta is normal. There are in large periaortic and pericaval lymph nodes. These are difficult to separate from adjacent soft tissues. One on image 75 measures 2.1 cm in short axis. The adrenal glands and kidneys are grossly normal. There is increased density within the mesenteric fat, and small amounts of fluid are present in the paracolic gutters with a moderate amount of fluid present within the pelvis. The urinary bladder is partially distended. The prostate gland and seminal vesicles are grossly normal. There is no inguinal hernia. There is no umbilical hernia.  The non-opacified loops of small and large bowel are grossly normal. No inflammatory changes are suspected. There is a paucity of subcutaneous fat diffusely.  The lumbar vertebral bodies are preserved in height. There are no lytic or blastic bony lesions of the lumbar spine nor of the pelvis.  IMPRESSION: 1. There are enlarged lymph nodes within the supraclavicular regions, mediastinum, hilar regions, and axillary regions within the thorax. Within the abdomen and pelvis there are enlarged periaortic and pericaval lymph nodes. This is worrisome for a lymphoproliferative disorder such as lymphoma. 2. There are small to moderate-sized bilateral pleural effusions. There is a small to moderate amount of ascites.  3. 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 No pulmonary parenchymal masses are demonstrated. Atelectasis or pneumonia is present in both lower lobes adjacent to the pleural fluid collections. There is no evidence of CHF. 4. There is mild intrahepatic ductal dilation of uncertain etiology. The gallbladder exhibits mild wall thickening without evidence of stones. As best as can be determined no acute pancreatic lesion is demonstrated. There is no splenomegaly. No acute abnormality of the kidneys is demonstrated. No acute abnormality of the visualized skeletal structures is demonstrated. 5. The sensitivity of the study is limited overall by the lack of IV and oral contrast. 6. These results were called by telephone at the time of interpretation on 05/18/2013 at 6:07 PM to Dr. Milton Ferguson , who verbally acknowledged these results.   Electronically Signed   By: David  Martinique   On: 05/18/2013 18:11   US Renal  05/19/2013   CLINICAL DATA:  Acute renal failure, question obstruction secondary to lymphoma  EXAM: RENAL/URINARY TRACT ULTRASOUND COMPLETE  COMPARISON:  CT abdomen and pelvis 05/19/2006  FINDINGS: Right Kidney:  Length: 11.4 cm. Normal cortical thickness. Upper normal cortical echogenicity. No mass, hydronephrosis or shadowing calcification.  Left Kidney:  Length: 11.0 cm. Normal cortical thickness. Upper normal cortical echogenicity. Small cyst at lower pole 2.1 x 1.6 x 1.8 cm, simple features. No solid mass, hydronephrosis shadowing calcification.  Bladder:  Normally distended.  Ureteral jets were not visualized.  BILATERAL pleural effusions noted.  Small amounts of ascites noted in pelvis and  at Morison's pouch.  IMPRESSION: No evidence of urinary tract obstruction.  Simple cyst inferior pole LEFT kidney 2.1 cm greatest size.  Small BILATERAL pleural effusions and minimal ascites.   Electronically Signed   By: Lavonia Dana M.D.   On: 05/19/2013 08:53    ROS: See chart Blood pressure 112/71,  pulse 96, temperature 97.3 F (36.3 C), temperature source Oral, resp. rate 16, height _0  (1.6 m), weight 45.36 kg (100 lb), SpO2 96.00%. Physical Exam: Pleasant black male no acute distress. He has multiple lymph nodes that are easily palpable in both axillas and inguinal regions.  Assessment/Plan: Impression: Lymphadenopathy, rule out lymphoma Plan: We'll do axillary lymph node biopsy on Friday, 05/22/2013. The risks and benefits of the procedure were fully explained to the patient, gave informed consent. His renal function is improving. He will need transfusion of red blood cells prior to surgical intervention due to the need for general anesthesia.  Jamesetta So 05/20/2013, 9:39 AM

## 2013-05-20 NOTE — Progress Notes (Signed)
Subjective: Patient seen in the bed.  He his smiling.  He reports that his nausea is much improved.  He admits that his appetite is still poor.  He complains of constipation.  Objective: Vital signs in last 24 hours: Temp:  [97.3 F (36.3 C)-98.8 F (37.1 C)] 97.3 F (36.3 C) (04/29 7322) Pulse Rate:  [93-113] 96 (04/29 0638) Resp:  [16-20] 16 (04/29 0254) BP: (112-135)/(56-81) 112/71 mmHg (04/29 0638) SpO2:  [88 %-98 %] 96 % (04/29 2706)  Intake/Output from previous day: 04/28 0800 - 04/29 0759 In: 1807.5 [P.O.:120; I.V.:1687.5] Out: 100 [Urine:100] Intake/Output this shift:    General appearance: alert, cooperative and no distress  Lab Results:   Recent Labs  05/19/13 0506 05/20/13 0531  WBC 7.3 7.9  HGB 8.3* 8.0*  HCT 25.1* 24.6*  PLT 51* 92*   BMET  Recent Labs  05/19/13 0506 05/20/13 0531  NA 131* 133*  K 4.4 5.0  CL 96 100  CO2 19 19  GLUCOSE 104* 130*  BUN 50* 48*  CREATININE 3.47* 2.56*  CALCIUM 7.8* 7.7*    Studies/Results: Ct Abdomen Pelvis Wo Contrast  05/18/2013   CLINICAL DATA:  3-4 week history of swollen lymph nodes in the neck, axillary regions, and groin  EXAM: CT CHEST WITHOUT CONTRAST; CT ABDOMEN AND PELVIS WITHOUT CONTRAST  TECHNIQUE: Multidetector CT imaging of the chest was performed following the standard protocol without IV contrast.; Multidetector CT imaging of the abdomen and pelvis was performed following the standard protocol without intravenous contrast.  COMPARISON:  DG CHEST 2 VIEW dated 05/18/2013  FINDINGS: CT scan of the chest: The study is quite limited without IV contrast. The cardiac chambers are normal in size. There are small to moderate-sized bilateral pleural effusions layering posteriorly. There are enlarged supraclavicular lymph nodes bilaterally. There is mild soft tissue fullness in the hilar regions but discrete lymph nodes cannot be clearly delineated. There is likely an enlarged lymph node in the lower right  paratracheal region measuring at least 1.5 cm in short axis. There are retrosternal lymph nodes which are poorly defined. One measures 2.3 cm in short axis. There are numerous mildly enlarged axillary lymph nodes. The largest on the right measures 1.3 cm in short axis. At lung window settings there are emphysematous changes bilaterally. There is mild atelectasis versus pneumonia adjacent to the bilateral pleural effusions. No pulmonary parenchymal masses are demonstrated.  The thoracic vertebral bodies are preserved in height. The sternum and ribs exhibit no acute abnormalities.  CT scan of the abdomen and pelvis: The liver is normal in contour. There is mild intrahepatic ductal dilation. There is a subcentimeter hypodensity in the left hepatic lobe on image 60 which exhibits Hounsfield measurement of +6. This likely reflects a cyst. The gallbladder is mildly distended. There is mild gallbladder wall thickening. No stones are evident. The pancreas is grossly normal where visualized. The spleen measures 10.3 cm AP x 9 cm transversely x 9.8 cm in superior to inferior dimension. The caliber of the abdominal aorta is normal. There are in large periaortic and pericaval lymph nodes. These are difficult to separate from adjacent soft tissues. One on image 75 measures 2.1 cm in short axis. The adrenal glands and kidneys are grossly normal. There is increased density within the mesenteric fat, and small amounts of fluid are present in the paracolic gutters with a moderate amount of fluid present within the pelvis. The urinary bladder is partially distended. The prostate gland and seminal vesicles are grossly normal.  There is no inguinal hernia. There is no umbilical hernia.  The non-opacified loops of small and large bowel are grossly normal. No inflammatory changes are suspected. There is a paucity of subcutaneous fat diffusely.  The lumbar vertebral bodies are preserved in height. There are no lytic or blastic bony lesions  of the lumbar spine nor of the pelvis.  IMPRESSION: 1. There are enlarged lymph nodes within the supraclavicular regions, mediastinum, hilar regions, and axillary regions within the thorax. Within the abdomen and pelvis there are enlarged periaortic and pericaval lymph nodes. This is worrisome for a lymphoproliferative disorder such as lymphoma. 2. There are small to moderate-sized bilateral pleural effusions. There is a small to moderate amount of ascites. 3. 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 No pulmonary parenchymal masses are demonstrated. Atelectasis or pneumonia is present in both lower lobes adjacent to the pleural fluid collections. There is no evidence of CHF. 4. There is mild intrahepatic ductal dilation of uncertain etiology. The gallbladder exhibits mild wall thickening without evidence of stones. As best as can be determined no acute pancreatic lesion is demonstrated. There is no splenomegaly. No acute abnormality of the kidneys is demonstrated. No acute abnormality of the visualized skeletal structures is demonstrated. 5. The sensitivity of the study is limited overall by the lack of IV and oral contrast. 6. These results were called by telephone at the time of interpretation on 05/18/2013 at 6:07 PM to Dr. Milton Davenport , who verbally acknowledged these results.   Electronically Signed   By: Gabriel  Davenport   On: 05/18/2013 18:11   Dg Chest 2 View  05/18/2013   CLINICAL DATA:  Left adenopathy.  EXAM: CHEST  2 VIEW  COMPARISON:  DG CHEST 2 VIEW dated 04/23/2013; CT ABD - PELV W/ CM dated 11/18/2012  FINDINGS: Mild fullness in the mediastinum noted. Mediastinal adenopathy or mass age cannot be excluded. Borderline cardiomegaly, pulmonary vascularity is normal. Bibasilar pulmonary infiltrates consistent with pneumonia noted. Associated atelectasis. Associated left pleural effusion present.  IMPRESSION: 1. Mediastinal fullness, adenopathy cannot be excluded. 2. Bibasilar prominent  pulmonary infiltrates with associated atelectasis. These infiltrates most consistent with pneumonia. 3. Left pleural effusion.   Electronically Signed   By: Gabriel Moores  Register   On: 05/18/2013 15:49   Ct Chest Wo Contrast  05/18/2013   CLINICAL DATA:  3-4 week history of swollen lymph nodes in the neck, axillary regions, and groin  EXAM: CT CHEST WITHOUT CONTRAST; CT ABDOMEN AND PELVIS WITHOUT CONTRAST  TECHNIQUE: Multidetector CT imaging of the chest was performed following the standard protocol without IV contrast.; Multidetector CT imaging of the abdomen and pelvis was performed following the standard protocol without intravenous contrast.  COMPARISON:  DG CHEST 2 VIEW dated 05/18/2013  FINDINGS: CT scan of the chest: The study is quite limited without IV contrast. The cardiac chambers are normal in size. There are small to moderate-sized bilateral pleural effusions layering posteriorly. There are enlarged supraclavicular lymph nodes bilaterally. There is mild soft tissue fullness in the hilar regions but discrete lymph nodes cannot be clearly delineated. There is likely an enlarged lymph node in the lower right paratracheal region measuring at least 1.5 cm in short axis. There are retrosternal lymph nodes which are poorly defined. One measures 2.3 cm in short axis. There are numerous mildly enlarged axillary lymph nodes. The largest on the right measures 1.3 cm in short axis. At lung window settings there are emphysematous changes bilaterally. There is mild atelectasis versus pneumonia adjacent  to the bilateral pleural effusions. No pulmonary parenchymal masses are demonstrated.  The thoracic vertebral bodies are preserved in height. The sternum and ribs exhibit no acute abnormalities.  CT scan of the abdomen and pelvis: The liver is normal in contour. There is mild intrahepatic ductal dilation. There is a subcentimeter hypodensity in the left hepatic lobe on image 60 which exhibits Hounsfield measurement of +6.  This likely reflects a cyst. The gallbladder is mildly distended. There is mild gallbladder wall thickening. No stones are evident. The pancreas is grossly normal where visualized. The spleen measures 10.3 cm AP x 9 cm transversely x 9.8 cm in superior to inferior dimension. The caliber of the abdominal aorta is normal. There are in large periaortic and pericaval lymph nodes. These are difficult to separate from adjacent soft tissues. One on image 75 measures 2.1 cm in short axis. The adrenal glands and kidneys are grossly normal. There is increased density within the mesenteric fat, and small amounts of fluid are present in the paracolic gutters with a moderate amount of fluid present within the pelvis. The urinary bladder is partially distended. The prostate gland and seminal vesicles are grossly normal. There is no inguinal hernia. There is no umbilical hernia.  The non-opacified loops of small and large bowel are grossly normal. No inflammatory changes are suspected. There is a paucity of subcutaneous fat diffusely.  The lumbar vertebral bodies are preserved in height. There are no lytic or blastic bony lesions of the lumbar spine nor of the pelvis.  IMPRESSION: 1. There are enlarged lymph nodes within the supraclavicular regions, mediastinum, hilar regions, and axillary regions within the thorax. Within the abdomen and pelvis there are enlarged periaortic and pericaval lymph nodes. This is worrisome for a lymphoproliferative disorder such as lymphoma. 2. There are small to moderate-sized bilateral pleural effusions. There is a small to moderate amount of ascites. 3. 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 No pulmonary parenchymal masses are demonstrated. Atelectasis or pneumonia is present in both lower lobes adjacent to the pleural fluid collections. There is no evidence of CHF. 4. There is mild intrahepatic ductal dilation of uncertain etiology. The gallbladder exhibits mild wall thickening  without evidence of stones. As best as can be determined no acute pancreatic lesion is demonstrated. There is no splenomegaly. No acute abnormality of the kidneys is demonstrated. No acute abnormality of the visualized skeletal structures is demonstrated. 5. The sensitivity of the study is limited overall by the lack of IV and oral contrast. 6. These results were called by telephone at the time of interpretation on 05/18/2013 at 6:07 PM to Dr. Milton Davenport , who verbally acknowledged these results.   Electronically Signed   By: Gabriel  Davenport   On: 05/18/2013 18:11   US Renal  05/19/2013   CLINICAL DATA:  Acute renal failure, question obstruction secondary to lymphoma  EXAM: RENAL/URINARY TRACT ULTRASOUND COMPLETE  COMPARISON:  CT abdomen and pelvis 05/19/2006  FINDINGS: Right Kidney:  Length: 11.4 cm. Normal cortical thickness. Upper normal cortical echogenicity. No mass, hydronephrosis or shadowing calcification.  Left Kidney:  Length: 11.0 cm. Normal cortical thickness. Upper normal cortical echogenicity. Small cyst at lower pole 2.1 x 1.6 x 1.8 cm, simple features. No solid mass, hydronephrosis shadowing calcification.  Bladder:  Normally distended.  Ureteral jets were not visualized.  BILATERAL pleural effusions noted.  Small amounts of ascites noted in pelvis and at Morison's pouch.  IMPRESSION: No evidence of urinary tract obstruction.  Simple cyst inferior  pole LEFT kidney 2.1 cm greatest size.  Small BILATERAL pleural effusions and minimal ascites.   Electronically Signed   By: Lavonia Dana M.D.   On: 05/19/2013 08:53    Medications: I have reviewed the patient's current medications.  Assessment/Plan: 1. Diffuse lymphadenopathy suspicious for lymphoma, may represent a lymphoproliferative disorder associated with rheumatoid arthritis or methotrexate therapy. If the latter is suspected from histology, discontinuation of methotrexate may reverse the abnormality.  Peripheral flow cytometry and T-cell  re-arrangement ordered. 2. Nausea, much improved on scheduled Reglan and Zofran.  Will continue for now.  3. Constipation, will order MOM 4. Normocytic, normochromic anemia with elevated RDW 5. Thrombocytopenia, ?secondary to methotrexate and plaquenil or on an immune basis.  6. Renal failure  7. Malnutrition  8. Elevated uric acid level  9. Elevated CRP and ESR  10. Rheumatoid arthritis, on methotrexate and plaquenil.   Patient and plan discussed with Dr. Farrel Gobble and he is in agreement with the aforementioned.      LOS: 2 days    Gabriel Davenport 05/20/2013

## 2013-05-21 ENCOUNTER — Inpatient Hospital Stay (HOSPITAL_COMMUNITY): Payer: Medicare Other

## 2013-05-21 DIAGNOSIS — E872 Acidosis, unspecified: Secondary | ICD-10-CM

## 2013-05-21 DIAGNOSIS — E875 Hyperkalemia: Secondary | ICD-10-CM

## 2013-05-21 DIAGNOSIS — R7989 Other specified abnormal findings of blood chemistry: Secondary | ICD-10-CM

## 2013-05-21 DIAGNOSIS — J9600 Acute respiratory failure, unspecified whether with hypoxia or hypercapnia: Secondary | ICD-10-CM

## 2013-05-21 DIAGNOSIS — D696 Thrombocytopenia, unspecified: Secondary | ICD-10-CM

## 2013-05-21 LAB — BLOOD GAS, ARTERIAL
ACID-BASE DEFICIT: 9.1 mmol/L — AB (ref 0.0–2.0)
Acid-base deficit: 13.2 mmol/L — ABNORMAL HIGH (ref 0.0–2.0)
Acid-base deficit: 6.7 mmol/L — ABNORMAL HIGH (ref 0.0–2.0)
BICARBONATE: 15.8 meq/L — AB (ref 20.0–24.0)
Bicarbonate: 13.3 mEq/L — ABNORMAL LOW (ref 20.0–24.0)
Bicarbonate: 16.9 mEq/L — ABNORMAL LOW (ref 20.0–24.0)
DRAWN BY: 25788
DRAWN BY: 317771
Delivery systems: POSITIVE
Drawn by: 27407
EXPIRATORY PAP: 6
FIO2: 0.5 %
INSPIRATORY PAP: 12
O2 CONTENT: 4 L/min
O2 CONTENT: 10 L/min
O2 SAT: 86.8 %
O2 SAT: 92.4 %
O2 Saturation: 96.7 %
PATIENT TEMPERATURE: 37
PATIENT TEMPERATURE: 37
PO2 ART: 59.2 mmHg — AB (ref 80.0–100.0)
PO2 ART: 81.5 mmHg (ref 80.0–100.0)
Patient temperature: 37
TCO2: 15.3 mmol/L (ref 0–100)
TCO2: 13.1 mmol/L (ref 0–100)
TCO2: 16.1 mmol/L (ref 0–100)
pCO2 arterial: 31.1 mmHg — ABNORMAL LOW (ref 35.0–45.0)
pCO2 arterial: 35.3 mmHg (ref 35.0–45.0)
pCO2 arterial: 26.3 mmHg — ABNORMAL LOW (ref 35.0–45.0)
pH, Arterial: 7.325 — ABNORMAL LOW (ref 7.350–7.450)
pH, Arterial: 7.2 — ABNORMAL LOW (ref 7.350–7.450)
pH, Arterial: 7.423 (ref 7.350–7.450)
pO2, Arterial: 87 mmHg (ref 80.0–100.0)

## 2013-05-21 LAB — BASIC METABOLIC PANEL
BUN: 47 mg/dL — ABNORMAL HIGH (ref 6–23)
BUN: 48 mg/dL — ABNORMAL HIGH (ref 6–23)
BUN: 54 mg/dL — ABNORMAL HIGH (ref 6–23)
CHLORIDE: 99 meq/L (ref 96–112)
CO2: 18 mEq/L — ABNORMAL LOW (ref 19–32)
CO2: 16 mEq/L — ABNORMAL LOW (ref 19–32)
CO2: 18 meq/L — AB (ref 19–32)
Calcium: 7.9 mg/dL — ABNORMAL LOW (ref 8.4–10.5)
Calcium: 7.9 mg/dL — ABNORMAL LOW (ref 8.4–10.5)
Calcium: 8.2 mg/dL — ABNORMAL LOW (ref 8.4–10.5)
Chloride: 103 mEq/L (ref 96–112)
Chloride: 99 mEq/L (ref 96–112)
Creatinine, Ser: 2.3 mg/dL — ABNORMAL HIGH (ref 0.50–1.35)
Creatinine, Ser: 2.3 mg/dL — ABNORMAL HIGH (ref 0.50–1.35)
Creatinine, Ser: 2.38 mg/dL — ABNORMAL HIGH (ref 0.50–1.35)
GFR calc Af Amer: 32 mL/min — ABNORMAL LOW (ref >90)
GFR calc non Af Amer: 28 mL/min — ABNORMAL LOW (ref >90)
GFR, EST AFRICAN AMERICAN: 33 mL/min — AB (ref >90)
GFR, EST AFRICAN AMERICAN: 33 mL/min — AB (ref >90)
GFR, EST NON AFRICAN AMERICAN: 29 mL/min — AB (ref >90)
GFR, EST NON AFRICAN AMERICAN: 29 mL/min — AB (ref >90)
Glucose, Bld: 120 mg/dL — ABNORMAL HIGH (ref 70–99)
Glucose, Bld: 203 mg/dL — ABNORMAL HIGH (ref 70–99)
Glucose, Bld: 191 mg/dL — ABNORMAL HIGH (ref 70–99)
POTASSIUM: 5.6 meq/L — AB (ref 3.7–5.3)
Potassium: 5.8 mEq/L — ABNORMAL HIGH (ref 3.7–5.3)
Potassium: 5 mEq/L (ref 3.7–5.3)
SODIUM: 131 meq/L — AB (ref 137–147)
Sodium: 134 mEq/L — ABNORMAL LOW (ref 137–147)
Sodium: 132 mEq/L — ABNORMAL LOW (ref 137–147)

## 2013-05-21 LAB — CBC
HCT: 23.8 % — ABNORMAL LOW (ref 39.0–52.0)
HEMOGLOBIN: 7.8 g/dL — AB (ref 13.0–17.0)
MCH: 26.6 pg (ref 26.0–34.0)
MCHC: 32.8 g/dL (ref 30.0–36.0)
MCV: 81.2 fL (ref 78.0–100.0)
Platelets: 61 10*3/uL — ABNORMAL LOW (ref 150–400)
RBC: 2.93 MIL/uL — ABNORMAL LOW (ref 4.22–5.81)
RDW: 17.7 % — AB (ref 11.5–15.5)
WBC: 7.9 10*3/uL (ref 4.0–10.5)

## 2013-05-21 LAB — PRO B NATRIURETIC PEPTIDE: Pro B Natriuretic peptide (BNP): 5312 pg/mL — ABNORMAL HIGH (ref 0–125)

## 2013-05-21 LAB — KAPPA/LAMBDA LIGHT CHAINS
Kappa free light chain: 21.6 mg/dL — ABNORMAL HIGH (ref 0.33–1.94)
Kappa, lambda light chain ratio: 1.58 (ref 0.26–1.65)
LAMDA FREE LIGHT CHAINS: 13.7 mg/dL — AB (ref 0.57–2.63)

## 2013-05-21 LAB — HEPATIC FUNCTION PANEL
ALBUMIN: 2 g/dL — AB (ref 3.5–5.2)
ALK PHOS: 172 U/L — AB (ref 39–117)
ALT: 6 U/L (ref 0–53)
AST: 19 U/L (ref 0–37)
BILIRUBIN INDIRECT: 0.3 mg/dL (ref 0.3–0.9)
BILIRUBIN TOTAL: 0.5 mg/dL (ref 0.3–1.2)
Bilirubin, Direct: 0.2 mg/dL (ref 0.0–0.3)
Total Protein: 7 g/dL (ref 6.0–8.3)

## 2013-05-21 LAB — PROTEIN ELECTROPHORESIS, SERUM
ALPHA-2-GLOBULIN: 13.9 % — AB (ref 7.1–11.8)
Albumin ELP: 31.4 % — ABNORMAL LOW (ref 55.8–66.1)
Alpha-1-Globulin: 10.8 % — ABNORMAL HIGH (ref 2.9–4.9)
BETA GLOBULIN: 5.5 % (ref 4.7–7.2)
Beta 2: 8.2 % — ABNORMAL HIGH (ref 3.2–6.5)
GAMMA GLOBULIN: 30.2 % — AB (ref 11.1–18.8)
M-Spike, %: NOT DETECTED g/dL
TOTAL PROTEIN ELP: 6.5 g/dL (ref 6.0–8.3)

## 2013-05-21 LAB — IMMUNOFIXATION ELECTROPHORESIS
IGG (IMMUNOGLOBIN G), SERUM: 1970 mg/dL — AB (ref 650–1600)
IgA: 447 mg/dL — ABNORMAL HIGH (ref 68–379)
IgM, Serum: 73 mg/dL (ref 41–251)
TOTAL PROTEIN ELP: 6.5 g/dL (ref 6.0–8.3)

## 2013-05-21 LAB — URINALYSIS, ROUTINE W REFLEX MICROSCOPIC
BILIRUBIN URINE: NEGATIVE
Glucose, UA: NEGATIVE mg/dL
KETONES UR: NEGATIVE mg/dL
Leukocytes, UA: NEGATIVE
NITRITE: NEGATIVE
Protein, ur: NEGATIVE mg/dL
SPECIFIC GRAVITY, URINE: 1.025 (ref 1.005–1.030)
UROBILINOGEN UA: 0.2 mg/dL (ref 0.0–1.0)
pH: 5 (ref 5.0–8.0)

## 2013-05-21 LAB — URINE MICROSCOPIC-ADD ON

## 2013-05-21 LAB — URIC ACID: Uric Acid, Serum: 10.5 mg/dL — ABNORMAL HIGH (ref 4.0–7.8)

## 2013-05-21 LAB — PREPARE PLATELET PHERESIS: Unit division: 0

## 2013-05-21 LAB — TROPONIN I
Troponin I: 0.32 ng/mL (ref <0.30)
Troponin I: <0.3 ng/mL (ref <0.30)

## 2013-05-21 LAB — RHEUMATOID FACTOR: RHEUMATOID FACTOR: 59 [IU]/mL — AB (ref <=14)

## 2013-05-21 LAB — PHOSPHORUS: PHOSPHORUS: 4.5 mg/dL (ref 2.3–4.6)

## 2013-05-21 LAB — LIPASE, BLOOD: Lipase: 19 U/L (ref 11–59)

## 2013-05-21 LAB — LACTIC ACID, PLASMA
LACTIC ACID, VENOUS: 1.9 mmol/L (ref 0.5–2.2)
Lactic Acid, Venous: 4.3 mmol/L — ABNORMAL HIGH (ref 0.5–2.2)

## 2013-05-21 LAB — ABO/RH: ABO/RH(D): A POS

## 2013-05-21 LAB — PREPARE RBC (CROSSMATCH)

## 2013-05-21 LAB — BETA 2 MICROGLOBULIN, SERUM: BETA 2 MICROGLOBULIN: 16.6 mg/L — AB (ref <=2.51)

## 2013-05-21 LAB — PROCALCITONIN: Procalcitonin: 16.38 ng/mL

## 2013-05-21 LAB — CK: Total CK: 55 U/L (ref 7–232)

## 2013-05-21 MED ORDER — ALBUTEROL SULFATE (2.5 MG/3ML) 0.083% IN NEBU: 3.0000 mL | INHALATION_SOLUTION | RESPIRATORY_TRACT | Status: DC | PRN

## 2013-05-21 MED ORDER — IPRATROPIUM-ALBUTEROL 0.5-2.5 (3) MG/3ML IN SOLN
3.0000 mL | RESPIRATORY_TRACT | Status: DC
  Administered 2013-05-21 – 2013-05-23 (×14): 3 mL via RESPIRATORY_TRACT
  Filled 2013-05-21 (×13): qty 3

## 2013-05-21 MED ORDER — FUROSEMIDE 10 MG/ML IJ SOLN
60.0000 mg | Freq: Two times a day (BID) | INTRAMUSCULAR | Status: DC
  Filled 2013-05-21: qty 6

## 2013-05-21 MED ORDER — DEXTROSE 5 % IV SOLN: 1.0000 g | Freq: Three times a day (TID) | INTRAVENOUS | Status: DC

## 2013-05-21 MED ORDER — STERILE WATER FOR INJECTION IV SOLN
INTRAVENOUS | Status: DC
  Administered 2013-05-21 – 2013-05-23 (×3): via INTRAVENOUS
  Filled 2013-05-21 (×8): qty 9.7

## 2013-05-21 MED ORDER — STERILE WATER FOR INJECTION IV SOLN
INTRAVENOUS | Status: DC
  Administered 2013-05-21: 12:00:00 via INTRAVENOUS
  Filled 2013-05-21 (×6): qty 9.7

## 2013-05-21 MED ORDER — SODIUM POLYSTYRENE SULFONATE 15 GM/60ML PO SUSP
15.0000 g | Freq: Once | ORAL | Status: AC
  Administered 2013-05-21: 15 g via ORAL
  Filled 2013-05-21: qty 60

## 2013-05-21 MED ORDER — DEXAMETHASONE SODIUM PHOSPHATE 4 MG/ML IJ SOLN
10.0000 mg | Freq: Three times a day (TID) | INTRAMUSCULAR | Status: DC
  Administered 2013-05-21 – 2013-05-23 (×7): 10 mg via INTRAVENOUS
  Filled 2013-05-21 (×7): qty 3

## 2013-05-21 MED ORDER — ALBUTEROL (5 MG/ML) CONTINUOUS INHALATION SOLN
10.0000 mg/h | INHALATION_SOLUTION | RESPIRATORY_TRACT | Status: DC
  Administered 2013-05-21: 10 mg/h via RESPIRATORY_TRACT

## 2013-05-21 MED ORDER — METHYLPREDNISOLONE SODIUM SUCC 40 MG IJ SOLR
40.0000 mg | Freq: Four times a day (QID) | INTRAMUSCULAR | Status: DC
  Administered 2013-05-21 (×2): 40 mg via INTRAVENOUS
  Filled 2013-05-21 (×2): qty 1

## 2013-05-21 MED ORDER — VANCOMYCIN HCL IN DEXTROSE 750-5 MG/150ML-% IV SOLN
750.0000 mg | INTRAVENOUS | Status: DC
  Administered 2013-05-22 – 2013-05-23 (×2): 750 mg via INTRAVENOUS
  Filled 2013-05-21 (×3): qty 150

## 2013-05-21 MED ORDER — SODIUM CHLORIDE 0.9 % IV SOLN
6.0000 mg | Freq: Once | INTRAVENOUS | Status: AC
  Administered 2013-05-21: 6 mg via INTRAVENOUS
  Filled 2013-05-21: qty 4

## 2013-05-21 MED ORDER — VANCOMYCIN HCL 10 G IV SOLR
1250.0000 mg | Freq: Once | INTRAVENOUS | Status: AC
  Administered 2013-05-21: 1250 mg via INTRAVENOUS
  Filled 2013-05-21: qty 1250

## 2013-05-21 MED ORDER — ALBUTEROL (5 MG/ML) CONTINUOUS INHALATION SOLN
INHALATION_SOLUTION | RESPIRATORY_TRACT | Status: AC
  Filled 2013-05-21: qty 20

## 2013-05-21 MED ORDER — DEXTROSE 5 % IV SOLN
1.0000 g | INTRAVENOUS | Status: DC
  Administered 2013-05-21 – 2013-05-23 (×3): 1 g via INTRAVENOUS
  Filled 2013-05-21 (×5): qty 1

## 2013-05-21 MED ORDER — FUROSEMIDE 10 MG/ML IJ SOLN
60.0000 mg | Freq: Two times a day (BID) | INTRAMUSCULAR | Status: DC
  Administered 2013-05-21 – 2013-05-22 (×2): 60 mg via INTRAVENOUS
  Filled 2013-05-21: qty 6

## 2013-05-21 MED ORDER — SODIUM CHLORIDE 0.9 % IV SOLN
20.0000 mg | Freq: Once | INTRAVENOUS | Status: AC
  Administered 2013-05-21: 20 mg via INTRAVENOUS
  Filled 2013-05-21: qty 2

## 2013-05-21 MED ORDER — DEXAMETHASONE SODIUM PHOSPHATE 4 MG/ML IJ SOLN: 20.0000 mg | Freq: Once | INTRAMUSCULAR | Status: DC

## 2013-05-21 NOTE — Progress Notes (Signed)
PROGRESS NOTE  Gabriel Davenport WUX:[id number] DOB: [birth year] DOA: 05/18/2013 PCP: [facility] NOT [facility]  Summary: 63 year old man retired [employer], presented with several week history of lymphadenopathy neck, axillary area, groin with associated weight loss, night sweats and fever. Admitted for further evaluation of diffuse lymphadenopathy concerning for lymphoma.  Assessment/Plan: 1. Diffuse lymphadenopathy, supraclavicular, mediastinal, hilar, axillary, periaortic, pericaval suspicious for lymphoma. 2. Acute renal failure of unclear etiology. With associated hyperkalemia and metabolic acidosis. Creatinine improving, BUN also, however now with hyperkalemia and metabolic acidosis. Renal ultrasound was unremarkable as was CT abdomen pelvis from a kidney standpoint. Urine output fair.  3. Acute hypoxic respiratory failure. Currently on nasal cannula, nontoxic but dramatic change from yesterday. History of severe COPD and clinical findings suggest COPD exacerbation.  4. Possible pneumonia. Suspected atelectasis, remains afebrile, has been on Levaquin since admission. 5. Bilateral pleural effusions and ascites on admission. Uncertain etiology and significance. LFTs normal. 6. Nausea, vomiting. Resolved. 7. Normocytic, normochromic anemia. Stable. 8. Thrombocytopenia. Fluctuating. Possibly related to methotrexate. 9. Hypertension. Stable. 10. Constipation. 11. Severe malnutrition in context of chronic illness, underweight. 12. History of rheumatoid arthritis. Maintained on methotrexate and Plaquenil. 13. Tobacco dependence in remission x3 weeks.  14. Mild intrahepatic ductal dilatation: Uncertain etiology and significance. Asymptomatic, normal LFTs.   Clinical status has changed overnight. Now significantly hypoxic and short of breath. Plan chest x-ray to reassess effusions, assess for infiltrate. Exam and history suggest COPD exacerbation.   Transfer to SDU for closer monitoring.    Steroids, scheduled bronchodilators, oxygen supplementation. Consider broadening antibiotics based on x-ray findings, however leukocytosis absent the patient does not appear to have infection.   Case discussed with nephrology, consultation planned, for now continue aggressive IV fluids. Some concern for spontaneous tumor lysis syndrome although phosphorus is normal. I will discuss with oncology.   Kayexalate for hyperkalemia.   Hold Plaquenil.  Continue aggressive bowel regimen.  Prognosis guarded.  Code Status: full code DVT prophylaxis: SCDs (thrombocytopenia) Family Communication: none present Disposition Plan: home when improved  Murray Hodgkins, MD  [facility]  Pager [phone] If 7PM-7AM, please contact night-coverage at [email or web address], password [facility] 05/21/2013, 8:02 AM  LOS: 3 days   Consultants:  General surgery  Oncology  Procedures:    Antibiotics:  Levaquin 4/27 >>   HPI/Subjective: Overnight patient be significantly hypoxic; oxygen increased to 4 L, then to venturi mask. It does not appear that physician was notified.  This morning the patient reports shortness of breath last night, wheezing, productive cough. No pain. He does report some swelling of his lower extremities. No bowel movement.  He reports occasional COPD exacerbations, especially in the spring time. He quit smoking 3 weeks ago.  Objective: Filed Vitals:   05/20/13 2126 05/20/13 2139 05/21/13 0647 05/21/13 0739  BP:   161/80   Pulse: 122  110   Temp:      TempSrc:      Resp:   20   Height:      Weight:   59.467 kg (131 lb 1.6 oz)   SpO2: 90% 93% 90% 95%    Intake/Output Summary (Last 24 hours) at 05/21/13 0802 Last data filed at 05/21/13 0636  Gross per 24 hour  Intake 2252.5 ml  Output    700 ml  Net 1552.5 ml     Filed Weights   05/18/13 1112 05/20/13 1624 05/21/13 0647  Weight: 45.36 kg (100 lb) 54.976 kg (121 lb 3.2 oz) 59.467 kg (131 lb 1.6 oz)  Exam:   Afebrile, vital signs are stable.   Gen. Appears calm, mildly uncomfortable, watching TV. Appears short of breath but nontoxic.  Eyes appear grossly unremarkable.  ENT grossly unremarkable.  Cardiovascular. Tachycardic, regular rhythm. No murmur, rub or gallop. No lower extremity edema.  Respiratory. Fair air movement, prolonged expiratory phase, audible wheezing. No frank rhonchi or rales. Moderate increased respiratory effort. No abdominal breathing. No nasal flaring. Able to speak in full sentences.  Abdomen remains distended, nontender.  Psychiatric. Grossly normal mood and affect. Speech fluent and appropriate. Oriented to self, month, year, location.  Data Reviewed:  Urine output recorded as 700.  Renal ultrasound unremarkable. Kidneys also appeared unremarkable and CT of the abdomen and pelvis on admission.   Creatinine 3.47 >> 2.56 >> 2.3. BUN modestly improved, 48 >> 47.  Hemoglobin stable 7.8. Platelet count fluctuating, 60.   Scheduled Meds: . [START ON 05/22/2013] sodium chloride  250 mL Intravenous Once  . [START ON 05/22/2013] acetaminophen  650 mg Oral Once  . albuterol  2.5 mg Nebulization BID  . amLODipine  10 mg Oral Daily  . antiseptic oral rinse  15 mL Mouth Rinse BID  . budesonide-formoterol  2 puff Inhalation BID  . cholecalciferol  1,000 Units Oral Daily  . [START ON 05/22/2013] diphenhydrAMINE  25 mg Oral Once  . docusate sodium  100 mg Oral BID  . feeding supplement (ENSURE COMPLETE)  237 mL Oral BID BM  . folic acid  1 mg Oral Daily  . gabapentin  300 mg Oral Daily  . hydroxychloroquine  200 mg Oral Q supper  . levofloxacin  500 mg Oral Q48H  . metoCLOPramide  10 mg Oral TID AC  . omega-3 acid ethyl esters  2 g Oral Daily  . ondansetron  8 mg Oral 3 times per day  . pantoprazole  40 mg Oral Daily  . polyethylene glycol  17 g Oral BID  . sodium polystyrene  15 g Oral Once  . traMADol  50 mg Oral Q12H   Continuous Infusions: .  sodium chloride 150 mL/hr at 05/21/13 6962    Principal Problem:   Lymphadenopathy Active Problems:   ARF (acute renal failure)   HTN (hypertension)   Fever   Night sweats   Protein-calorie malnutrition, severe   Rheumatoid arthritis   Time spent 35 minutes

## 2013-05-21 NOTE — Progress Notes (Addendum)
Respiratory status without significant improvement with nebulizer therapy. Placed on BiPAP and now breathing much better, feels much better.  No chest pain, no abdominal pain, no pain elsewhere. No other complaints except short of breath.  Hemodynamics remained stable. Overall he appears more comfortable. Lungs with improved air movement, diffuse wheezes still hurt. No rhonchi or rales. Respiratory effort now in the 20s. Able to speak clearly while on BiPAP. dorsalis pedis pulses 2+ bilaterally. Hands and feet warm with grossly normal perfusion.follows commands, alert oriented to self, location, month, year. Abdomen somewhat distended but soft and nontender.  ABG    Component Value Date/Time   PHART 7.200* 05/21/2013 1427   PCO2ART 35.3 05/21/2013 1427   PO2ART 81.5 05/21/2013 1427   HCO3 13.3* 05/21/2013 1427   TCO2 13.1 05/21/2013 1427   ACIDBASEDEF 13.2* 05/21/2013 1427   O2SAT 92.4 05/21/2013 1427    Lactic acid 4.3. BUN, creatinine without significant change. CO2 slightly lower, 16. Potassium without significant change. Chest x-ray with modest enlargement of pleural effusions right greater than left. Abdominal film unremarkable. Total CK normal.  Patient has acute respiratory failure of unclear etiology with metabolic acidosis, lactic acidosis of unclear etiology, acute renal failure and persistent anemia and thrombocytopenia. History and exam suggest COPD.  Discussed with PCCM Dr. Chase Caller. Plan continue BiPAP, check troponins, lipase, hepatic panel, procalcitonin, BNP.  Repeat lactic acid and ABG at 8 pm and f/u with elink.  Murray Hodgkins, MD [facility] [phone]  Addendum [phone] Patient awake, alert, follows commands. Feels much better. RR down to low 20s, HR down to low 100s. Appears much better. Improved air movement on exam with more rapid expiratory phase. abd soft. Exam without change. Skin appears unremarkable, no rash or lesions. Perineum clear.  Troponin .32, BNP  5312, normal lipase  A/P  Acute hypoxic respiratory failure, much improved on BiPAP  Acute renal failure with metabolic acidosis, lactic acidosis, hyperkalemia. Stable since this AM. Plan to repeat labs tonight. Etiology unclear, nephrology following.  ID. No definite infection, consider pneumonia, abx broadened to cefepime, vancomycin  Cardiac. Stable hemodynamics. Troponin minimally elevated. No chest pain, EKG non-acute. Favor strain rather than ACS. Cycle troponins. EKG in AM. Elevated BNP of unclear sig. Lasix per nephrology. Echo.  Heme. Stable plts and hgb. Plan transfusion PRBC when volume status more stable.   Neuro. Intact, alert, oriented, follows commands.  Patient critically ill. Discussed with son and daughter at bedside. Labs tonight. Will discuss with night coverage Dr. Darrick Meigs for f/u.  Murray Hodgkins, MD

## 2013-05-21 NOTE — Progress Notes (Signed)
ANTIBIOTIC CONSULT NOTE - INITIAL  Pharmacy Consult for Vancomycin and Cefepime (renal dose adjustment of antibiotics per protocol)  Indication: pneumonia  Allergies  Allergen Reactions  . Other     "new antibiotic pill, caused Gabriel rash" unable to recall name of medication.    Patient Measurements: Height: 5\' 3"  (160 cm) Weight: 124 lb 12.8 oz (56.609 kg) IBW/kg (Calculated) : 56.9  Vital Signs: Temp: 97.7 F (36.5 C) (04/30 0853) Temp src: Oral (04/30 0853) BP: 129/69 mmHg (04/30 1200) Pulse Rate: 39 (04/30 1249) Intake/Output from previous day: 04/29 0701 - 04/30 0700 In: 2252.5 [P.O.:480; I.V.:1772.5] Out: 700 [Urine:700] Intake/Output from this shift: Total I/O In: -  Out: 100 [Urine:100]  Labs:  Recent Labs  05/19/13 0506 05/20/13 0531 05/21/13 0528  WBC 7.3 7.9 7.9  HGB 8.3* 8.0* 7.8*  PLT 51* 92* 61*  CREATININE 3.47* 2.56* 2.30*   Estimated Creatinine Clearance: 26.7 ml/min (by C-G formula based on Cr of 2.3). No results found for this basename: VANCOTROUGH, VANCOPEAK, VANCORANDOM, GENTTROUGH, GENTPEAK, GENTRANDOM, TOBRATROUGH, TOBRAPEAK, TOBRARND, AMIKACINPEAK, AMIKACINTROU, AMIKACIN,  in the last 72 hours   Microbiology: No results found for this or any previous visit (from the past 720 hour(s)).  Medical History: Past Medical History  Diagnosis Date  . Arthritis   . Hypertension   . Stomach problems   . Gastroenteritis   . Rheumatoid arthritis 05/19/2013   Medications:  Scheduled:  . [START ON 05/22/2013] sodium chloride  250 mL Intravenous Once  . [START ON 05/22/2013] acetaminophen  650 mg Oral Once  . albuterol      . amLODipine  10 mg Oral Daily  . antiseptic oral rinse  15 mL Mouth Rinse BID  . ceFEPime (MAXIPIME) IV  1 g Intravenous Q24H  . cholecalciferol  1,000 Units Oral Daily  . [START ON 05/22/2013] diphenhydrAMINE  25 mg Oral Once  . docusate sodium  100 mg Oral BID  . feeding supplement (ENSURE COMPLETE)  237 mL Oral BID BM  .  folic acid  1 mg Oral Daily  . furosemide  60 mg Intravenous BID  . gabapentin  300 mg Oral Daily  . ipratropium-albuterol  3 mL Nebulization Q4H  . methylPREDNISolone (SOLU-MEDROL) injection  40 mg Intravenous Q6H  . metoCLOPramide  10 mg Oral TID AC  . omega-3 acid ethyl esters  2 g Oral Daily  . ondansetron  8 mg Oral 3 times per day  . pantoprazole  40 mg Oral Daily  . polyethylene glycol  17 g Oral BID  . traMADol  50 mg Oral Q12H  . vancomycin  1,250 mg Intravenous Once  . [START ON 05/22/2013] vancomycin  750 mg Intravenous Q24H   Assessment: 63yo male with several week h/o lymphadenopathy, weight loss, night sweats and fever.  Pt was initially treated with Levaquin.  Asked to initiate to Vancomycin and Cefepime for suspected pneumonia.  SCr elevated on admission but is gradually improving.  Normalized ClCr ~ 26-33 ml/min  Levaquin 4/27 >> 4/30 Vancomycin 4/30 >> Cefepime 4/30 >>  Goal of Therapy:  Vancomycin trough level 15-20 mcg/ml  Plan:  Cefepime 1gm IV q24hrs (renally adjusted) Vancomycin 1250mg  IV today x 1 (loading dose) then Vancomycin 750mg  IV q24hrs Check trough at steady state Monitor labs, renal fxn, and cultures  Gabriel Davenport Gabriel Davenport 05/21/2013,1:55 PM

## 2013-05-21 NOTE — Progress Notes (Signed)
PT IS RESTING QUIETLY ON BIPAP. DENIES ANY DISTRESS OR DISCOMFORT FOLEY CATH PATENT AND DRAINING SMALL AMT CLEAR YELLOW URINE. WE ARE HAVING DIFFICULTY OBTAINING A CONTINUS O2 SAT READING D/T PT'S HANDS BEING COLD.WE HAVE ATTEMPTED TO WARM HIS HAND SO THE O2 SAT READING WOULD BE CONSTANT.

## 2013-05-21 NOTE — Progress Notes (Signed)
Subjective: "My breathing is getting better."  The patient's condition has decompensated.  His labs demonstrate an acidosis.  He is now in ICU on BiPaP.  Case discussed with Dr. Sarajane Jews.  Objective: Vital signs in last 24 hours: Temp:  [97.7 F (36.5 C)-98.4 F (36.9 C)] 97.7 F (36.5 C) (04/30 0853) Pulse Rate:  [39-125] 39 (04/30 1249) Resp:  [20-27] 26 (04/30 1200) BP: (129-165)/(60-82) 129/69 mmHg (04/30 1200) SpO2:  [82 %-95 %] 94 % (04/30 1258) FiO2 (%):  [40 %-50 %] 50 % (04/30 1258) Weight:  [121 lb 3.2 oz (54.976 kg)-131 lb 1.6 oz (59.467 kg)] 124 lb 12.8 oz (56.609 kg) (04/30 0806)  Intake/Output from previous day: 04/29 0800 - 04/30 0759 In: 2252.5 [P.O.:480; I.V.:1772.5] Out: 700 [Urine:700] Intake/Output this shift: Total I/O In: -  Out: 100 [Urine:100]  General appearance: alert, cooperative, no distress and on BiPap  Lab Results:   Recent Labs  05/20/13 0531 05/21/13 0528  WBC 7.9 7.9  HGB 8.0* 7.8*  HCT 24.6* 23.8*  PLT 92* 61*   BMET  Recent Labs  05/20/13 0531 05/21/13 0528  NA 133* 134*  K 5.0 5.6*  CL 100 103  CO2 19 18*  GLUCOSE 130* 120*  BUN 48* 47*  CREATININE 2.56* 2.30*  CALCIUM 7.7* 7.9*    Studies/Results: Dg Chest 2 View  05/21/2013   CLINICAL DATA:  Hypoxia, shortness of breath, wheezing  EXAM: CHEST  2 VIEW  COMPARISON:  05/18/2013  FINDINGS: Slightly enlarging small to moderate pleural effusions noted bilaterally with bibasilar collapse/consolidation. Background emphysema noted, better appreciated on the CT scan. Right paratracheal prominence remains compatible with mediastinal adenopathy also present on the CT. Trachea is midline. Negative for pneumothorax.  IMPRESSION: Enlarging small to moderate pleural effusions with bibasilar atelectasis/ consolidation.  Background COPD/ emphysema  Mediastinal adenopathy   Electronically Signed   By: Daryll Brod M.D.   On: 05/21/2013 08:44   Dg Abd 2 Views  05/21/2013   CLINICAL  DATA:  Abdominal distention  EXAM: ABDOMEN - 2 VIEW  COMPARISON:  CT, 05/18/2013  FINDINGS: No evidence of bowel obstruction. No free air. There are few nonspecific air-fluid levels on the erect view.  Soft tissue planes are poorly defined.  There are bilateral pleural effusions with associated atelectasis.  No significant bony abnormality.  IMPRESSION: 1. No obstruction or free air.  No convincing acute finding. 2. Few nonspecific air-fluid levels within small bowel and colon. This may reflect a minor adynamic ileus. 3. Bilateral pleural effusions with associated atelectasis.   Electronically Signed   By: Lajean Manes M.D.   On: 05/21/2013 09:08    Medications: I have reviewed the patient's current medications.  Assessment/Plan: 1. Diffuse lymphadenopathy suspicious for lymphoma, may represent a lymphoproliferative disorder associated with rheumatoid arthritis or methotrexate therapy. If the latter is suspected from histology, discontinuation of methotrexate may reverse the abnormality.  Awaiting excisional lymph node biopsy for diagnosis and planning of treatment. 2. 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  Paracentesis can be performed if patient is uncomfortable and sent for cytology and FLOW 3. Acute renal failure with hyperkalemia and metabolic acidosis.  Will start 20 mg of IV Decadron stat and 10 mg of Decadron IV every 8 hours (this has some lymphoma treatment properties as well). 4. Hyperuricemia, will order Rasburicase IV today. 5. Acute hypoxic respiratory failure 6. Normocytic anemia, worsening. Consider 2 unit PRBC transfusion 7. Thrombocytopenia, stable.  Recommend platelet transfusion with  irradiated platelets for a platelet count of less than 10,000 and/or active bleeding.   Patient and plan discussed with Dr. Farrel Gobble and he is in agreement with the aforementioned.      LOS: 3 days    Baird Cancer 05/21/2013

## 2013-05-21 NOTE — Progress Notes (Signed)
05/21/13 1048 Late entry for 0900. Notified Dr. Sarajane Jews of chest x-ray results and IVF rate still ordered at 150 ml/hour. Stated would like fluids to remain at ordered rate until Dr. Lowanda Foster makes recommendations this morning. Donavan Foil, RN

## 2013-05-21 NOTE — Consult Note (Addendum)
Reason for Consult:AKI Referring Physician: Dr. Cammie Mcgee Gabriel Davenport is an 63 y.o. male.  HPI: He is a patient who has history of hypertension, rheumatoid arthritis presently came with a complaints of weight loss, lymphadenopathy around  his neck and axillary area. According to patient this happened in a short period. Patient also states that he has low grade fever and lost about 15 pounds in 2 months. He has some cough but no sputum production. When patient was evaluated was found to have acute kidney injury his consult is called. Patient denies any previous history of renal failure or kidney stone. Presently patient is being worked up for lymphoproliferative disease. Patient denies any nausea vomiting but her appetite is not that good.  Past Medical History  Diagnosis Date  . Arthritis   . Hypertension   . Stomach problems   . Gastroenteritis   . Rheumatoid arthritis 05/19/2013    History reviewed. No pertinent past surgical history.  No family history on file.  Social History:  reports that he quit smoking about 3 weeks ago. His smoking use included Cigarettes. He smoked 0.00 packs per day. He quit smokeless tobacco use about 3 weeks ago. He reports that he does not drink alcohol or use illicit drugs.  Allergies:  Allergies  Allergen Reactions  . Other     "new antibiotic pill, caused a rash" unable to recall name of medication.     Medications: I have reviewed the patient's current medications.  Results for orders placed during the hospital encounter of 05/18/13 (from the past 48 hour(s))  EPSTEIN-BARR VIRUS VCA ANTIBODY PANEL     Status: Abnormal   Collection Time    05/19/13 11:03 AM      Result Value Ref Range   EBV VCA IgG 743.0 (*) <18.0 U/mL   Comment: (NOTE)     Reference Range:       <18.0 U/mL = Negative                       18.0-21.9 U/mL = Equivocal                          >=22.0 U/mL = Positive   EBV VCA IgM 10.4  <36.0 U/mL   Comment: (NOTE)   Reference Range:       <36.0 U/mL = Negative                       36.0-43.9 U/mL = Equivocal                          >=44.0 U/mL = Positive   EBV NA IgG 337.0 (*) <18.0 U/mL   Comment: (NOTE)     Reference Range:       <18.0 U/mL = Negative                       18.0-21.9 U/mL = Equivocal                          >=22.0 U/mL = Positive           Clinical Stage            VCA IgG   VCA IgM      EA    EBV NA  Susceptibility               -         -         -       -           Very Early Infection        +/-       +/-        -       -           Established Infection        +         +        +/-      -           Recent Infection             +         +        +/-     +/-           Past Infection               +         -        +/-      +                                                                                     +/- means positive or negative (not weak)     High persisting antibody levels may be present in Burkitt's lymphoma     and nasopharyngeal carcinoma.   EBV EA IgG 12.6 (*) <9.0 U/mL   Comment: (NOTE)     Reference Range:        <9.0 U/mL = Negative                        9.0-10.9 U/mL = Equivocal                          >=11.0 U/mL = Positive     Assay cross-reactivity for Early Antigen (EA) has been noted with some     specimens containing antibody to Human Immunodeficiency Virus (HIV).     HIV disease must be excluded before confirmation of EBV diagnosis.       Performed at [facility]     Status: None   Collection Time    05/19/13  8:15 PM      Result Value Ref Range   Unit Number [id number]     Blood Component Type PLTPHER LI3     Unit division 00     Status of Unit ISSUED,FINAL     Transfusion Status OK TO TRANSFUSE    TYPE AND SCREEN     Status: None   Collection Time    05/19/13  8:15 PM      Result Value Ref Range   ABO/RH(D) A POS     Antibody Screen NEG     Sample Expiration 05/22/2013    BASIC  METABOLIC PANEL  Status: Abnormal   Collection Time    05/20/13  5:31 AM      Result Value Ref Range   Sodium 133 (*) 137 - 147 mEq/L   Potassium 5.0  3.7 - 5.3 mEq/L   Chloride 100  96 - 112 mEq/L   CO2 19  19 - 32 mEq/L   Glucose, Bld 130 (*) 70 - 99 mg/dL   BUN 48 (*) 6 - 23 mg/dL   Creatinine, Ser 2.56 (*) 0.50 - 1.35 mg/dL   Calcium 7.7 (*) 8.4 - 10.5 mg/dL   GFR calc non Af Amer 25 (*) >90 mL/min   GFR calc Af Amer 29 (*) >90 mL/min   Comment: (NOTE)     The eGFR has been calculated using the CKD EPI equation.     This calculation has not been validated in all clinical situations.     eGFR's persistently <90 mL/min signify possible Chronic Kidney     Disease.  CBC     Status: Abnormal   Collection Time    05/20/13  5:31 AM      Result Value Ref Range   WBC 7.9  4.0 - 10.5 K/uL   RBC 3.01 (*) 4.22 - 5.81 MIL/uL   Hemoglobin 8.0 (*) 13.0 - 17.0 g/dL   HCT 24.6 (*) 39.0 - 52.0 %   MCV 81.7  78.0 - 100.0 fL   MCH 26.6  26.0 - 34.0 pg   MCHC 32.5  30.0 - 36.0 g/dL   RDW 17.6 (*) 11.5 - 15.5 %   Platelets 92 (*) 150 - 400 K/uL   Comment: SPECIMEN CHECKED FOR CLOTS     PLATELET COUNT CONFIRMED BY SMEAR  CBC     Status: Abnormal   Collection Time    05/21/13  5:28 AM      Result Value Ref Range   WBC 7.9  4.0 - 10.5 K/uL   RBC 2.93 (*) 4.22 - 5.81 MIL/uL   Hemoglobin 7.8 (*) 13.0 - 17.0 g/dL   HCT 23.8 (*) 39.0 - 52.0 %   MCV 81.2  78.0 - 100.0 fL   MCH 26.6  26.0 - 34.0 pg   MCHC 32.8  30.0 - 36.0 g/dL   RDW 17.7 (*) 11.5 - 15.5 %   Platelets 61 (*) 150 - 400 K/uL   Comment: SPECIMEN CHECKED FOR CLOTS     PLATELET COUNT CONFIRMED BY SMEAR     RESULT REPEATED AND VERIFIED  BASIC METABOLIC PANEL     Status: Abnormal   Collection Time    05/21/13  5:28 AM      Result Value Ref Range   Sodium 134 (*) 137 - 147 mEq/L   Potassium 5.6 (*) 3.7 - 5.3 mEq/L   Chloride 103  96 - 112 mEq/L   CO2 18 (*) 19 - 32 mEq/L   Glucose, Bld 120 (*) 70 - 99 mg/dL   BUN 47 (*) 6  - 23 mg/dL   Creatinine, Ser 2.30 (*) 0.50 - 1.35 mg/dL   Calcium 7.9 (*) 8.4 - 10.5 mg/dL   GFR calc non Af Amer 29 (*) >90 mL/min   GFR calc Af Amer 33 (*) >90 mL/min   Comment: (NOTE)     The eGFR has been calculated using the CKD EPI equation.     This calculation has not been validated in all clinical situations.     eGFR's persistently <90 mL/min signify possible Chronic Kidney     Disease.  PHOSPHORUS     Status: None   Collection Time    05/21/13  5:28 AM      Result Value Ref Range   Phosphorus 4.5  2.3 - 4.6 mg/dL  URIC ACID     Status: Abnormal   Collection Time    05/21/13  5:28 AM      Result Value Ref Range   Uric Acid, Serum 10.5 (*) 4.0 - 7.8 mg/dL  CK     Status: None   Collection Time    05/21/13  5:28 AM      Result Value Ref Range   Total CK 55  7 - 232 U/L  BLOOD GAS, ARTERIAL     Status: Abnormal   Collection Time    05/21/13  8:50 AM      Result Value Ref Range   O2 Content 4.0     Delivery systems NASAL CANNULA     pH, Arterial 7.325 (*) 7.350 - 7.450   pCO2 arterial 31.1 (*) 35.0 - 45.0 mmHg   pO2, Arterial 59.2 (*) 80.0 - 100.0 mmHg   Bicarbonate 15.8 (*) 20.0 - 24.0 mEq/L   TCO2 15.3  0 - 100 mmol/L   Acid-base deficit 9.1 (*) 0.0 - 2.0 mmol/L   O2 Saturation 86.8     Patient temperature 37.0     Collection site RIGHT RADIAL     Drawn by [id number]     Sample type ARTERIAL DRAW     Allens test (pass/fail) PASS  PASS    Dg Chest 2 View  05/21/2013   CLINICAL DATA:  Hypoxia, shortness of breath, wheezing  EXAM: CHEST  2 VIEW  COMPARISON:  05/18/2013  FINDINGS: Slightly enlarging small to moderate pleural effusions noted bilaterally with bibasilar collapse/consolidation. Background emphysema noted, better appreciated on the CT scan. Right paratracheal prominence remains compatible with mediastinal adenopathy also present on the CT. Trachea is midline. Negative for pneumothorax.  IMPRESSION: Enlarging small to moderate pleural effusions with bibasilar  atelectasis/ consolidation.  Background COPD/ emphysema  Mediastinal adenopathy   Electronically Signed   By: Daryll Brod M.D.   On: 05/21/2013 08:44   Dg Abd 2 Views  05/21/2013   CLINICAL DATA:  Abdominal distention  EXAM: ABDOMEN - 2 VIEW  COMPARISON:  CT, 05/18/2013  FINDINGS: No evidence of bowel obstruction. No free air. There are few nonspecific air-fluid levels on the erect view.  Soft tissue planes are poorly defined.  There are bilateral pleural effusions with associated atelectasis.  No significant bony abnormality.  IMPRESSION: 1. No obstruction or free air.  No convincing acute finding. 2. Few nonspecific air-fluid levels within small bowel and colon. This may reflect a minor adynamic ileus. 3. Bilateral pleural effusions with associated atelectasis.   Electronically Signed   By: Lajean Manes M.D.   On: 05/21/2013 09:08    Review of Systems  Constitutional: Positive for fever, weight loss and malaise/fatigue.  Respiratory: Positive for cough, shortness of breath and wheezing.   Gastrointestinal: Negative for nausea and vomiting.   Blood pressure 146/76, pulse 114, temperature 97.7 F (36.5 C), temperature source Oral, resp. rate 20, height 5' 3"  (1.6 m), weight 56.609 kg (124 lb 12.8 oz), SpO2 91.00%. Physical Exam  Constitutional: He is oriented to person, place, and time. No distress.  Emaciated  Eyes: No scleral icterus.  Neck: JVD present.  Cardiovascular: Normal rate and regular rhythm.   Respiratory: He has wheezes.  GI: He exhibits distension. There is no tenderness.  There is no rebound.  Musculoskeletal: He exhibits no edema.  Neurological: He is alert and oriented to person, place, and time.    Assessment/Plan: Problem #1 acute kidney injury: Most likely prerenal/ATN/interstitial nephritis from medication such as methotrexate. Presently his BUN and creatinine is improving. Ultrasound of his kidneys show any obstruction. Problem #2 acidosis: His CO2 is 18 and pH of  7.32 with PCO2 of 31. Most likely secondary to metabolic acidosis . Presently his CO2 seems to be declining. Problem #3 hyperkalemia: Problem #4 hypertension his blood pressure is reasonably controlled Problem #5 rheumatoid arthritis: Patient on methotrexate and his rheumatoid arthritis seems to be controlled. Problem #6 multiple lymphadenopathy: Including Supra clavicular, Hillar and Medistanal. Patient positive for EBV him being followed to hematology. Problem #7 patient with elevated  Serum IgG and IgA. Immunoelectrophoresis is pending. Problem #8 bilateral pleural effusion Problem #9 Emphysema : Patient presently complains of difficulty in breathing and worst today. On inhaler . Patient with history of smoking and stopped recently Plan: Will DC his IV fluid We'll start him on 1/4 saline with 50 mEq of sodium bicarbonate 125 cc per hour We'll start him on Lasix 60 mg IV twice a day We'll check his complement We'll check 24-hour urine for protein and immunoelectrophoresis. Recheck his basic metabolic panel in the morning. Low potassium diet Harriett Sine 05/21/2013, 9:56 AM

## 2013-05-21 NOTE — Progress Notes (Signed)
05/21/13 1046 Report given to Jadene Pierini, RN in stepdown. Patient transferred to [facility] room 6 via bed in stable condition accompanied by nurse and nurse tech. Patient stated would like nursing staff to notify family of transfer once he is settled. Notified receiving nurse. Donavan Foil, RN

## 2013-05-22 ENCOUNTER — Inpatient Hospital Stay (HOSPITAL_COMMUNITY): Payer: Medicare Other

## 2013-05-22 ENCOUNTER — Encounter (HOSPITAL_COMMUNITY)
Admission: EM | Disposition: A | Discharge status: Other Acute Inpatient Hospital | Payer: Self-pay | Source: Home / Self Care | Attending: Family Medicine

## 2013-05-22 DIAGNOSIS — E872 Acidosis, unspecified: Secondary | ICD-10-CM

## 2013-05-22 DIAGNOSIS — J189 Pneumonia, unspecified organism: Secondary | ICD-10-CM

## 2013-05-22 DIAGNOSIS — R188 Other ascites: Secondary | ICD-10-CM

## 2013-05-22 DIAGNOSIS — J9601 Acute respiratory failure with hypoxia: Secondary | ICD-10-CM

## 2013-05-22 DIAGNOSIS — J441 Chronic obstructive pulmonary disease with (acute) exacerbation: Secondary | ICD-10-CM

## 2013-05-22 LAB — COMPREHENSIVE METABOLIC PANEL
ALT: 5 U/L (ref 0–53)
AST: 15 U/L (ref 0–37)
Albumin: 1.8 g/dL — ABNORMAL LOW (ref 3.5–5.2)
Alkaline Phosphatase: 131 U/L — ABNORMAL HIGH (ref 39–117)
BUN: 61 mg/dL — ABNORMAL HIGH (ref 6–23)
CALCIUM: 8.2 mg/dL — AB (ref 8.4–10.5)
CO2: 18 mEq/L — ABNORMAL LOW (ref 19–32)
Chloride: 103 mEq/L (ref 96–112)
Creatinine, Ser: 2.4 mg/dL — ABNORMAL HIGH (ref 0.50–1.35)
GFR calc non Af Amer: 27 mL/min — ABNORMAL LOW (ref >90)
GFR, EST AFRICAN AMERICAN: 32 mL/min — AB (ref >90)
GLUCOSE: 145 mg/dL — AB (ref 70–99)
Potassium: 4.8 mEq/L (ref 3.7–5.3)
Sodium: 135 mEq/L — ABNORMAL LOW (ref 137–147)
TOTAL PROTEIN: 6.4 g/dL (ref 6.0–8.3)
Total Bilirubin: 0.5 mg/dL (ref 0.3–1.2)

## 2013-05-22 LAB — CBC
HCT: 23.3 % — ABNORMAL LOW (ref 39.0–52.0)
HEMOGLOBIN: 7.9 g/dL — AB (ref 13.0–17.0)
MCH: 26.9 pg (ref 26.0–34.0)
MCHC: 33.9 g/dL (ref 30.0–36.0)
MCV: 79.3 fL (ref 78.0–100.0)
Platelets: 41 10*3/uL — ABNORMAL LOW (ref 150–400)
RBC: 2.94 MIL/uL — ABNORMAL LOW (ref 4.22–5.81)
RDW: 17.6 % — ABNORMAL HIGH (ref 11.5–15.5)
WBC: 9.7 10*3/uL (ref 4.0–10.5)

## 2013-05-22 LAB — TROPONIN I: Troponin I: <0.3 ng/mL (ref <0.30)

## 2013-05-22 LAB — HIV ANTIBODY (ROUTINE TESTING W REFLEX): HIV 1&2 Ab, 4th Generation: NONREACTIVE

## 2013-05-22 LAB — C3 COMPLEMENT: C3 Complement: 117 mg/dL (ref 90–180)

## 2013-05-22 LAB — PROCALCITONIN: PROCALCITONIN: 35.99 ng/mL

## 2013-05-22 LAB — C4 COMPLEMENT: COMPLEMENT C4, BODY FLUID: 27 mg/dL (ref 10–40)

## 2013-05-22 LAB — PHOSPHORUS: PHOSPHORUS: 4.6 mg/dL (ref 2.3–4.6)

## 2013-05-22 LAB — STREP PNEUMONIAE URINARY ANTIGEN: STREP PNEUMO URINARY ANTIGEN: NEGATIVE

## 2013-05-22 LAB — URIC ACID: Uric Acid, Serum: 1.7 mg/dL — ABNORMAL LOW (ref 4.0–7.8)

## 2013-05-22 SURGERY — AXILLARY LYMPH NODE BIOPSY
Anesthesia: General | Laterality: Bilateral

## 2013-05-22 MED ORDER — SODIUM CHLORIDE 0.9 % IJ SOLN: 10.0000 mL | INTRAMUSCULAR | Status: DC | PRN

## 2013-05-22 MED ORDER — FUROSEMIDE 10 MG/ML IJ SOLN
100.0000 mg | Freq: Two times a day (BID) | INTRAVENOUS | Status: DC
  Administered 2013-05-22 – 2013-05-23 (×2): 100 mg via INTRAVENOUS
  Filled 2013-05-22 (×7): qty 10

## 2013-05-22 MED ORDER — SENNA 8.6 MG PO TABS
1.0000 | ORAL_TABLET | Freq: Every day | ORAL | Status: DC
  Administered 2013-05-22: 8.6 mg via ORAL
  Filled 2013-05-22: qty 1

## 2013-05-22 MED ORDER — ACETAMINOPHEN 325 MG PO TABS
650.0000 mg | ORAL_TABLET | Freq: Once | ORAL | Status: AC
  Administered 2013-05-22: 650 mg via ORAL
  Filled 2013-05-22: qty 2

## 2013-05-22 MED ORDER — HEPARIN SOD (PORK) LOCK FLUSH 100 UNIT/ML IV SOLN: 500.0000 [IU] | Freq: Every day | INTRAVENOUS | Status: DC | PRN

## 2013-05-22 MED ORDER — POLYETHYLENE GLYCOL 3350 17 G PO PACK
17.0000 g | PACK | Freq: Three times a day (TID) | ORAL | Status: DC
  Administered 2013-05-22 (×2): 17 g via ORAL
  Filled 2013-05-22 (×2): qty 1

## 2013-05-22 MED ORDER — SODIUM CHLORIDE 0.9 % IJ SOLN: 3.0000 mL | INTRAMUSCULAR | Status: DC | PRN

## 2013-05-22 MED ORDER — HEPARIN SOD (PORK) LOCK FLUSH 100 UNIT/ML IV SOLN: 250.0000 [IU] | INTRAVENOUS | Status: DC | PRN

## 2013-05-22 MED ORDER — SODIUM CHLORIDE 0.9 % IV SOLN
250.0000 mL | Freq: Once | INTRAVENOUS | Status: AC
  Administered 2013-05-22: 250 mL via INTRAVENOUS

## 2013-05-22 MED ORDER — DIPHENHYDRAMINE HCL 25 MG PO CAPS
25.0000 mg | ORAL_CAPSULE | Freq: Once | ORAL | Status: AC
  Administered 2013-05-22: 25 mg via ORAL
  Filled 2013-05-22: qty 1

## 2013-05-22 NOTE — Progress Notes (Signed)
Subjective: In bed with family at the bedside (1 son and 1 daughter).  He is breathing much better.  Clinically, looking much improved today.  He denies any complaints this afternoon.  Objective: Vital signs in last 24 hours: Temp:  [97.5 F (36.4 C)-98.4 F (36.9 C)] 97.7 F (36.5 C) (05/01 0800) Pulse Rate:  [29-111] 102 (05/01 0800) Resp:  [12-34] 17 (05/01 0800) BP: (109-164)/(59-85) 113/59 mmHg (05/01 0500) SpO2:  [80 %-100 %] 94 % (05/01 0800) FiO2 (%):  [40 %-50 %] 50 % (05/01 0753) Weight:  [125 lb 10.6 oz (57 kg)] 125 lb 10.6 oz (57 kg) (05/01 0500)  Intake/Output from previous day: 04/30 0800 - 05/01 0759 In: 1396.8 [I.V.:850.8; IV Piggyback:346] Out: 500 [Urine:500] Intake/Output this shift:    General appearance: alert, cooperative, appears stated age and no distress  Lab Results:   Recent Labs  05/21/13 0528 05/22/13 0858  WBC 7.9 9.7  HGB 7.8* 7.9*  HCT 23.8* 23.3*  PLT 61* 41*   BMET  Recent Labs  05/21/13 2110 05/22/13 0346  NA 132* 135*  K 5.0 4.8  CL 99 103  CO2 18* 18*  GLUCOSE 191* 145*  BUN 54* 61*  CREATININE 2.38* 2.40*  CALCIUM 8.2* 8.2*   Results for Gabriel Davenport, TALLERICO (MRN [id number]) as of 05/22/2013 13:24  Ref. Range 05/19/2013 09:53 05/21/2013 05:28 05/22/2013 03:46  Uric Acid, Serum Latest Range: 4.0-7.8 mg/dL 11.9 (H) 10.5 (H) 1.7 (L)   Studies/Results: Dg Chest 2 View  05/21/2013   CLINICAL DATA:  Hypoxia, shortness of breath, wheezing  EXAM: CHEST  2 VIEW  COMPARISON:  05/18/2013  FINDINGS: Slightly enlarging small to moderate pleural effusions noted bilaterally with bibasilar collapse/consolidation. Background emphysema noted, better appreciated on the CT scan. Right paratracheal prominence remains compatible with mediastinal adenopathy also present on the CT. Trachea is midline. Negative for pneumothorax.  IMPRESSION: Enlarging small to moderate pleural effusions with bibasilar atelectasis/ consolidation.  Background COPD/  emphysema  Mediastinal adenopathy   Electronically Signed   By: Gabriel Brod M.D.   On: 05/21/2013 08:44   Dg Abd 2 Views  05/21/2013   CLINICAL DATA:  Abdominal distention  EXAM: ABDOMEN - 2 VIEW  COMPARISON:  CT, 05/18/2013  FINDINGS: No evidence of bowel obstruction. No free air. There are few nonspecific air-fluid levels on the erect view.  Soft tissue planes are poorly defined.  There are bilateral pleural effusions with associated atelectasis.  No significant bony abnormality.  IMPRESSION: 1. No obstruction or free air.  No convincing acute finding. 2. Few nonspecific air-fluid levels within small bowel and colon. This may reflect a minor adynamic ileus. 3. Bilateral pleural effusions with associated atelectasis.   Electronically Signed   By: Gabriel Manes M.D.   On: 05/21/2013 09:08    Medications: I have reviewed the patient's current medications.  Assessment/Plan: 1. Diffuse lymphadenopathy suspicious for lymphoma, may represent a lymphoproliferative disorder associated with rheumatoid arthritis or methotrexate therapy. If the latter is suspected from histology, discontinuation of methotrexate may reverse the abnormality. Awaiting excisional lymph node biopsy for diagnosis and planning of treatment.   Continue IV Dexamethasone. 2. 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 Paracentesis can be performed if patient is uncomfortable and please send fluid for cytology and FLOW  3. Acute renal failure with hyperkalemia and metabolic acidosis. Will start 20 mg of IV Decadron stat and 10 mg of Decadron IV every 8 hours (this has some lymphoma treatment properties as  well).  Followed by nephrology. 4. Hyperuricemia, S/P rasburicase on 05/21/2013 with excellent response. 5. Acute hypoxic respiratory failure  6. Normocytic anemia, worsening. Will order a 2 unit PRBC transfusion (irradiated) with Tylenol and Benadryl pre-med.   He is already on Lasix and  this was recently increased by nephrology.  7. Thrombocytopenia, stable. Recommend platelet transfusion with irradiated platelets for a platelet count of less than 10,000 and/or active bleeding. 8. If patient decompensates over weekend, recommend transfer to [facility] for admission and IR consult for lymph node excisional biopsy as diagnosis will continue to be delayed if patient declines clinically.    Patient and plan discussed with Gabriel Davenport and he is in agreement with the aforementioned.      LOS: 4 days    Baird Cancer 05/22/2013 Pager: [phone]

## 2013-05-22 NOTE — Progress Notes (Signed)
Subjective: Interval History: Patient says that he's feeling much better. Has this difficulty breathing. He doesn't have any nausea vomiting however his appetite is poor.  Objective: Vital signs in last 24 hours: Temp:  [97.5 F (36.4 C)-98.4 F (36.9 C)] 97.7 F (36.5 C) (05/01 0800) Pulse Rate:  [29-111] 102 (05/01 0800) Resp:  [12-34] 17 (05/01 0800) BP: (109-164)/(59-85) 113/59 mmHg (05/01 0500) SpO2:  [80 %-100 %] 94 % (05/01 0800) FiO2 (%):  [40 %-50 %] 50 % (05/01 0753) Weight:  [57 kg (125 lb 10.6 oz)] 57 kg (125 lb 10.6 oz) (05/01 0500) Weight change: 1.633 kg (3 lb 9.6 oz)  Intake/Output from previous day: 04/30 0701 - 05/01 0700 In: 1396.8 [I.V.:850.8; IV Piggyback:346] Out: 500 [Urine:500] Intake/Output this shift:    General appearance: alert, cooperative and no distress Resp: diminished breath sounds posterior - bilateral, dullness to percussion posterior - bilateral and wheezes bilaterally Cardio: regular rate and rhythm, S1, S2 normal, no murmur, click, rub or gallop GI: soft, non-tender; bowel sounds normal; no masses,  no organomegaly Extremities: edema Trace to 1+ edema on the right and trace on the left  Lab Results:  Recent Labs  05/21/13 0528 05/22/13 0858  WBC 7.9 9.7  HGB 7.8* 7.9*  HCT 23.8* 23.3*  PLT 61* 41*   BMET:  Recent Labs  05/21/13 2110 05/22/13 0346  NA 132* 135*  K 5.0 4.8  CL 99 103  CO2 18* 18*  GLUCOSE 191* 145*  BUN 54* 61*  CREATININE 2.38* 2.40*  CALCIUM 8.2* 8.2*   No results found for this basename: PTH,  in the last 72 hours Iron Studies: No results found for this basename: IRON, TIBC, TRANSFERRIN, FERRITIN,  in the last 72 hours  Studies/Results: Dg Chest 2 View  05/21/2013   CLINICAL DATA:  Hypoxia, shortness of breath, wheezing  EXAM: CHEST  2 VIEW  COMPARISON:  05/18/2013  FINDINGS: Slightly enlarging small to moderate pleural effusions noted bilaterally with bibasilar collapse/consolidation. Background  emphysema noted, better appreciated on the CT scan. Right paratracheal prominence remains compatible with mediastinal adenopathy also present on the CT. Trachea is midline. Negative for pneumothorax.  IMPRESSION: Enlarging small to moderate pleural effusions with bibasilar atelectasis/ consolidation.  Background COPD/ emphysema  Mediastinal adenopathy   Electronically Signed   By: Daryll Brod M.D.   On: 05/21/2013 08:44   Dg Abd 2 Views  05/21/2013   CLINICAL DATA:  Abdominal distention  EXAM: ABDOMEN - 2 VIEW  COMPARISON:  CT, 05/18/2013  FINDINGS: No evidence of bowel obstruction. No free air. There are few nonspecific air-fluid levels on the erect view.  Soft tissue planes are poorly defined.  There are bilateral pleural effusions with associated atelectasis.  No significant bony abnormality.  IMPRESSION: 1. No obstruction or free air.  No convincing acute finding. 2. Few nonspecific air-fluid levels within small bowel and colon. This may reflect a minor adynamic ileus. 3. Bilateral pleural effusions with associated atelectasis.   Electronically Signed   By: Lajean Manes M.D.   On: 05/21/2013 09:08    I have reviewed the patient's current medications.  Assessment/Plan: Problem #1 acute kidney injury: Presently multifactorial including ATN/prerenal/interstitial nephritis. At this moment other etiologies cannot be ruled out. His BUN and creatinine is still increasing. Presently patient is none oliguric. Problem #2 metabolic acidosis: He CO2 is 18 remains stable. His IV fluid was decreased because of concern for CHF. Problem #2 difficulty in breathing : Possibly a combination of empyema and  also increasing bilateral pleural effusion with bibasilar atelectasis/consolidation. Hence doesn't seem to be secondary to significant fluid overload. Patient is on antibiotics and also inhalers. His breathing difficulties seems to be better. Problem #3 lymphoproliferative disease: Patient is being followed by  rheumatology. Presently patient is started on dexamethasone. Problem #4 hypertension: His blood pressure seems to be reasonably controlled Problem #5 rheumatoid arthritis Problem #6 hyperkalemia: His potassium has corrected Problem #7 anemia: His hemoglobin and hematocrit is low Plan: Increase Lasix to 100 mg IV twice a day In his IV fluid with sodium bicarbonate to 100 cc per hour. We'll check his basic metabolic panel in the morning.   LOS: 4 days   Harriett Sine 05/22/2013,12:07 PM

## 2013-05-22 NOTE — OR Nursing (Signed)
Surgery was cancelled this morning by Dr. Arnoldo Morale .  Change in patient health status.

## 2013-05-22 NOTE — Progress Notes (Signed)
Biopsy for today canceled do to worsening overall condition.

## 2013-05-22 NOTE — Progress Notes (Addendum)
PROGRESS NOTE  Gabriel Davenport ERX:[id number] DOB: 10-17-1950 DOA: 05/18/2013 PCP: [facility]  Summary: 63 year old man retired [employer], presented with several week history of lymphadenopathy neck, axillary area, groin with associated weight loss, night sweats and fever. Admitted for further evaluation of diffuse lymphadenopathy concerning for lymphoma.  Assessment/Plan: 1. Diffuse lymphadenopathy, supraclavicular, mediastinal, hilar, axillary, periaortic, pericaval suspicious for lymphoma. Biopsy planned when clinical condition improved. 2. Acute renal failure of unclear etiology. No significant change overnight. Hyperkalemia has resolved, urine output fair. Renal ultrasound was unremarkable as was CT abdomen pelvis from a kidney standpoint.  3. Acute metabolic acidosis, much improved today. PH now normal. Suspect secondary to renal failure. Monitor clinically. 4. Acute hypoxic respiratory failure. Much improved today, off BiPAP. Suspect multifactorial, COPD, possible pneumonia.  5. COPD exacerbation. Much improved. Continue steroids, supplemental oxygen, bronchodilators. 6. Bilateral pleural effusions and ascites on admission. Uncertain etiology and significance. LFTs normal. Ascites may be secondary to compression from lymphadenopathy. 7. Nausea, vomiting. Resolved. 8. Normocytic, normochromic anemia.  9. Thrombocytopenia. Possibly related to methotrexate. Recommend platelet transfusion with irradiated platelets for a platelet count of less than 10,000 and/or active bleeding 10. Hypertension. Stable. 11. Constipation. Continue aggressive bowel regimen. 12. Severe malnutrition in context of chronic illness, underweight. 13. History of rheumatoid arthritis. Maintained on methotrexate and Plaquenil. Currently on hold. 14. Tobacco dependence in remission x3 weeks.  15. Mild intrahepatic ductal dilatation: Uncertain etiology and significance. Asymptomatic, normal LFTs.   Biopsy  on hold for now until clinical condition improves.  Continue broad-spectrum antibiotics cefepime vancomycin for possible pneumonia.  Plan paracentesis today, diagnostic.  Consider transfusion packed red blood cells. Monitor platelet count.  Continue aggressive bowel regimen.  No POA, 4 total children, no wife. Patient requested update family. Updated son Emir by telephone this AM, he will update siblings. (Also long discussion with son Mak, daughter last night).  Code Status: full code DVT prophylaxis: SCDs (thrombocytopenia) Family Communication: none present Disposition Plan: home when improved  Murray Hodgkins, MD  [facility]  Pager [phone] If 7PM-7AM, please contact night-coverage at [email or web address], password [facility] 05/22/2013, 9:09 AM  LOS: 4 days   Consultants:  General surgery  Oncology  Procedures:    Antibiotics:  Levaquin 4/27 >>   HPI/Subjective: No issues overnight. No bowel movement yet. The patient feels quite well. "I feel ready to go home". Breathing well. Absolutely no pain. No nausea or vomiting. No complaints at this time.  Objective: Filed Vitals:   05/22/13 0500 05/22/13 0600 05/22/13 0753 05/22/13 0800  BP: 113/59     Pulse: 93 100  102  Temp:    97.7 F (36.5 C)  TempSrc:    Oral  Resp: 15 23  17   Height:      Weight: 57 kg (125 lb 10.6 oz)     SpO2: 96% 94% 97% 94%    Intake/Output Summary (Last 24 hours) at 05/22/13 0909 Last data filed at 05/22/13 0600  Gross per 24 hour  Intake 1396.83 ml  Output    400 ml  Net 996.83 ml     Filed Weights   05/21/13 0647 05/21/13 0806 05/22/13 0500  Weight: 59.467 kg (131 lb 1.6 oz) 56.609 kg (124 lb 12.8 oz) 57 kg (125 lb 10.6 oz)    Exam:   Afebrile, vital signs are stable. SpO2 98% on Venturi at 50% respiratory rate normal.  Gen. Appears calm, comfortable, much better today. Nontoxic.  Psychiatric. Grossly normal mood and affect. Speech fluent and  appropriate.  Respiratory. Clear to auscultation bilaterally. Very few wheezes. Good air movement. No rhonchi or rales. Mild increased respiratory effort. Able to speak in full senses. Dramatically better today.  Cardiovascular. Regular rate and rhythm. No murmur, rub or gallop. No lower extremity edema. Perfusion grossly intact.  Eyes. Pupils, irises, lids appear unremarkable.  ENT. Appears grossly unremarkable.  Abdomen. Distended without significant change. Nontender. Soft. Positive bowel sounds.  Skin. No rash or induration.  Data Reviewed:  Urine output: 500.  I/O +4619 since admission  Creatinine 2.3 >> 2.40. BUN 54  >> 61. Phosphorus normal. CO2 18.  Troponin is negative.  Repeat lactic acid normal. Procalcitonin elevated.  Urinalysis unremarkable.  CBC pending.  Scheduled Meds: . sodium chloride  250 mL Intravenous Once  . amLODipine  10 mg Oral Daily  . antiseptic oral rinse  15 mL Mouth Rinse BID  . ceFEPime (MAXIPIME) IV  1 g Intravenous Q24H  . cholecalciferol  1,000 Units Oral Daily  . dexamethasone  10 mg Intravenous 3 times per day  . docusate sodium  100 mg Oral BID  . feeding supplement (ENSURE COMPLETE)  237 mL Oral BID BM  . folic acid  1 mg Oral Daily  . furosemide  60 mg Intravenous BID  . gabapentin  300 mg Oral Daily  . ipratropium-albuterol  3 mL Nebulization Q4H  . metoCLOPramide  10 mg Oral TID AC  . omega-3 acid ethyl esters  2 g Oral Daily  . ondansetron  8 mg Oral 3 times per day  . pantoprazole  40 mg Oral Daily  . polyethylene glycol  17 g Oral BID  . traMADol  50 mg Oral Q12H  . vancomycin  750 mg Intravenous Q24H   Continuous Infusions: . albuterol 10 mg/hr (05/21/13 1318)  .  sodium bicarbonate infusion 1/4 NS 1000 mL 50 mL/hr at 05/21/13 2259    Principal Problem:   Lymphadenopathy Active Problems:   ARF (acute renal failure)   HTN (hypertension)   Fever   Night sweats   Protein-calorie malnutrition, severe    Rheumatoid arthritis   Metabolic acidosis   Acute respiratory failure with hypoxia   COPD exacerbation   Ascites   Pneumonia   Time spent 30 minutes

## 2013-05-23 ENCOUNTER — Inpatient Hospital Stay (HOSPITAL_COMMUNITY): Payer: Medicare Other

## 2013-05-23 DIAGNOSIS — M069 Rheumatoid arthritis, unspecified: Secondary | ICD-10-CM | POA: Diagnosis not present

## 2013-05-23 DIAGNOSIS — J90 Pleural effusion, not elsewhere classified: Secondary | ICD-10-CM | POA: Diagnosis not present

## 2013-05-23 DIAGNOSIS — E872 Acidosis, unspecified: Secondary | ICD-10-CM | POA: Diagnosis not present

## 2013-05-23 DIAGNOSIS — E43 Unspecified severe protein-calorie malnutrition: Secondary | ICD-10-CM | POA: Diagnosis not present

## 2013-05-23 DIAGNOSIS — J189 Pneumonia, unspecified organism: Secondary | ICD-10-CM | POA: Diagnosis not present

## 2013-05-23 DIAGNOSIS — I2789 Other specified pulmonary heart diseases: Secondary | ICD-10-CM | POA: Diagnosis not present

## 2013-05-23 DIAGNOSIS — I059 Rheumatic mitral valve disease, unspecified: Secondary | ICD-10-CM

## 2013-05-23 DIAGNOSIS — E860 Dehydration: Secondary | ICD-10-CM | POA: Diagnosis not present

## 2013-05-23 DIAGNOSIS — Z681 Body mass index (BMI) 19 or less, adult: Secondary | ICD-10-CM | POA: Diagnosis not present

## 2013-05-23 DIAGNOSIS — R599 Enlarged lymph nodes, unspecified: Secondary | ICD-10-CM | POA: Diagnosis present

## 2013-05-23 DIAGNOSIS — J441 Chronic obstructive pulmonary disease with (acute) exacerbation: Secondary | ICD-10-CM

## 2013-05-23 DIAGNOSIS — D696 Thrombocytopenia, unspecified: Secondary | ICD-10-CM | POA: Diagnosis not present

## 2013-05-23 DIAGNOSIS — Z87891 Personal history of nicotine dependence: Secondary | ICD-10-CM | POA: Diagnosis not present

## 2013-05-23 DIAGNOSIS — D638 Anemia in other chronic diseases classified elsewhere: Secondary | ICD-10-CM | POA: Diagnosis not present

## 2013-05-23 DIAGNOSIS — K5900 Constipation, unspecified: Secondary | ICD-10-CM | POA: Diagnosis not present

## 2013-05-23 DIAGNOSIS — N179 Acute kidney failure, unspecified: Secondary | ICD-10-CM | POA: Diagnosis not present

## 2013-05-23 DIAGNOSIS — R188 Other ascites: Secondary | ICD-10-CM | POA: Diagnosis not present

## 2013-05-23 DIAGNOSIS — I10 Essential (primary) hypertension: Secondary | ICD-10-CM | POA: Diagnosis not present

## 2013-05-23 DIAGNOSIS — E875 Hyperkalemia: Secondary | ICD-10-CM | POA: Diagnosis not present

## 2013-05-23 DIAGNOSIS — C8589 Other specified types of non-Hodgkin lymphoma, extranodal and solid organ sites: Secondary | ICD-10-CM | POA: Diagnosis not present

## 2013-05-23 DIAGNOSIS — J9600 Acute respiratory failure, unspecified whether with hypoxia or hypercapnia: Secondary | ICD-10-CM | POA: Diagnosis not present

## 2013-05-23 LAB — TYPE AND SCREEN
ABO/RH(D): A POS
ANTIBODY SCREEN: NEGATIVE
UNIT DIVISION: 0
Unit division: 0

## 2013-05-23 LAB — BLOOD GAS, ARTERIAL
ACID-BASE DEFICIT: 4.9 mmol/L — AB (ref 0.0–2.0)
Bicarbonate: 18.9 mEq/L — ABNORMAL LOW (ref 20.0–24.0)
DRAWN BY: 23534
O2 CONTENT: 100 L/min
O2 Saturation: 91.3 %
PH ART: 7.41 (ref 7.350–7.450)
Patient temperature: 37
TCO2: 17.1 mmol/L (ref 0–100)
pCO2 arterial: 30.4 mmHg — ABNORMAL LOW (ref 35.0–45.0)
pO2, Arterial: 65.8 mmHg — ABNORMAL LOW (ref 80.0–100.0)

## 2013-05-23 LAB — LEGIONELLA ANTIGEN, URINE: Legionella Antigen, Urine: NEGATIVE

## 2013-05-23 LAB — BASIC METABOLIC PANEL
BUN: 68 mg/dL — AB (ref 6–23)
CHLORIDE: 99 meq/L (ref 96–112)
CO2: 20 mEq/L (ref 19–32)
Calcium: 8.3 mg/dL — ABNORMAL LOW (ref 8.4–10.5)
Creatinine, Ser: 2.43 mg/dL — ABNORMAL HIGH (ref 0.50–1.35)
GFR calc Af Amer: 31 mL/min — ABNORMAL LOW (ref >90)
GFR calc non Af Amer: 27 mL/min — ABNORMAL LOW (ref >90)
GLUCOSE: 133 mg/dL — AB (ref 70–99)
Potassium: 5.1 mEq/L (ref 3.7–5.3)
Sodium: 131 mEq/L — ABNORMAL LOW (ref 137–147)

## 2013-05-23 LAB — CBC
HCT: 30.7 % — ABNORMAL LOW (ref 39.0–52.0)
Hemoglobin: 10.8 g/dL — ABNORMAL LOW (ref 13.0–17.0)
MCH: 28.1 pg (ref 26.0–34.0)
MCHC: 35.2 g/dL (ref 30.0–36.0)
MCV: 79.9 fL (ref 78.0–100.0)
Platelets: 35 10*3/uL — ABNORMAL LOW (ref 150–400)
RBC: 3.84 MIL/uL — AB (ref 4.22–5.81)
RDW: 16 % — ABNORMAL HIGH (ref 11.5–15.5)
WBC: 16.8 10*3/uL — AB (ref 4.0–10.5)

## 2013-05-23 LAB — COMPLEMENT, TOTAL

## 2013-05-23 LAB — PROCALCITONIN: Procalcitonin: 31.86 ng/mL

## 2013-05-23 MED ORDER — FUROSEMIDE 10 MG/ML IJ SOLN
120.0000 mg | Freq: Two times a day (BID) | INTRAVENOUS | Status: DC
  Filled 2013-05-23 (×4): qty 12

## 2013-05-23 NOTE — Progress Notes (Signed)
Subjective: Interval History: Patient  Had  difficulty breathing this morning when he got up presently feeing better. He doesn't have any nausea vomiting.  Objective: Vital signs in last 24 hours: Temp:  [96.5 F (35.8 C)-97.9 F (36.6 C)] 96.5 F (35.8 C) (05/02 0800) Pulse Rate:  [27-149] 86 (05/02 0600) Resp:  [11-23] 11 (05/02 0600) BP: (77-148)/(53-85) 114/69 mmHg (05/02 0600) SpO2:  [42 %-98 %] 91 % (05/02 0729) FiO2 (%):  [45 %-50 %] 45 % (05/02 0729) Weight:  [56.7 kg (125 lb)] 56.7 kg (125 lb) (05/02 0500) Weight change: 0.091 kg (3.2 oz)  Intake/Output from previous day: 05/01 0701 - 05/02 0700 In: 2433.3 [I.V.:2083.3; Blood:350] Out: 950 [Urine:950] Intake/Output this shift:    Generally he is alert on oxygen Chest with inspiratory rhonchi and wheezing Heart RRR Abdomen is full none tender and positive bowl sound Extremities 1+ dema  Lab Results:  Recent Labs  05/22/13 0858 05/23/13 0501  WBC 9.7 16.8*  HGB 7.9* 10.8*  HCT 23.3* 30.7*  PLT 41* 35*   BMET:   Recent Labs  05/22/13 0346 05/23/13 0501  NA 135* 131*  K 4.8 5.1  CL 103 99  CO2 18* 20  GLUCOSE 145* 133*  BUN 61* 68*  CREATININE 2.40* 2.43*  CALCIUM 8.2* 8.3*   No results found for this basename: PTH,  in the last 72 hours Iron Studies: No results found for this basename: IRON, TIBC, TRANSFERRIN, FERRITIN,  in the last 72 hours  Studies/Results: US Abdomen Limited  05/22/2013   CLINICAL DATA:  Ascites by CT, assessment for paracentesis  EXAM: LIMITED ABDOMEN ULTRASOUND FOR ASCITES  TECHNIQUE: Limited ultrasound survey for ascites was performed in all four abdominal quadrants.  COMPARISON:  CT abdomen and pelvis 05/18/2013  FINDINGS: Survey imaging of the 4 quadrants was performed to assess for ascites.  Only minimal ascites identified in the lower quadrants bilaterally and adjacent the liver.  Volume of ascites sonographically visualized is insufficient for paracentesis.  IMPRESSION:  Minimal ascites, insufficient for paracentesis.   Electronically Signed   By: Lavonia Dana M.D.   On: 05/22/2013 13:55    I have reviewed the patient's current medications.  Assessment/Plan: Problem #1 acute kidney injury: Presently multifactorial including ATN/prerenal/interstitial nephritis. His renal function continue to increase Problem #2 metabolic acidosis: He CO2 is 20 has improved . Patient on sodium bicarbonate Problem #2 difficulty in breathing : Possibly a combination of empyema and also increasing bilateral pleural effusion with bibasilar atelectasis/consolidation. Presently has episode of difficulty in breathing. His urine out is better but still low . Presently on lasix. Problem #3 lymphoproliferative disease: Patient is being followed by rheumatology. Presently patient is started on dexamethasone. Problem #4 hypertension: His blood pressure is fluctuating but reasonably controlled Problem #5 rheumatoid arthritis Problem #6 hyperkalemia: His potassium is high normal Problem #7 anemia: His hemoglobin and hematocrit is low Plan: Increase Lasix to 120 mg IV twice a day Decrease his IV fluid with sodium bicarbonate to 75 cc per hour. We'll check his basic metabolic panel in the morning.   LOS: 5 days   Legaci Tarman S Lallie Strahm 05/23/2013,9:44 AM

## 2013-05-23 NOTE — Discharge Summary (Addendum)
Physician Transfer Summary  Gabriel Davenport [id number] DOB: 11/20/1950 DOA: 05/18/2013  PCP: PROVIDER NOT IN [place] date: 05/18/2013 Transfer date: 05/23/2013  Accepting facility: [facility]  Accepting MD: Dr. Dorothyann Peng  Reason for transfer: Acute hypoxic respiratory failure, possible pneumonitis, acute renal failure, suspected lymphoma  Diagnoses at time of transfer:  1. Acute hypoxic respiratory failure 2. Suspected developing pneumonitis versus volume overload 3. Acute renal failure 4. Diffuse lymphadenopathy concerning for lymphoma 5. COPD with suspected exacerbation 6. Bilateral pleural effusions, ascites 7. Normocytic normochromic anemia 8. Thrombocytopenia 9. Severe malnutrition in context of chronic illness 10. History of rheumatoid arthritis 11. Tobacco dependence in remission for 3 weeks 12. Mild intrahepatic ductal dilatation of unclear significance  Condition at time of transfer:  Hypoxic respiratory failure with stable oxygenation on high flow oxygen   Filed Weights   05/21/13 0806 05/22/13 0500 05/23/13 0500  Weight: 56.609 kg (124 lb 12.8 oz) 57 kg (125 lb 10.6 oz) 56.7 kg (125 lb)    History of present illness:  63 year old man retired [employer], presented with several week history of lymphadenopathy neck, axillary area, groin with associated weight loss, night sweats and fever. Admitted for further evaluation of diffuse lymphadenopathy concerning for lymphoma. Other issue on admission acute renal failure.  Hospital Course:  Patient was admitted to the medical floor, seen in consultation with oncology and general surgery. Plans were made for excisional biopsy of axillary lymph node for definitive diagnosis with treatment for suspected lymphoma to follow. Acute renal failure was treated with IV fluids with some improvement. Approximately 72 hours into his hospitalization he developed acute respiratory decompensation with hypoxic respiratory failure  requiring BiPAP. Repeat imaging of the chest revealed stable pleural effusions from CAT scan on admission without definite infiltrate or interstitial process. Exam was suggestive of severe bronchospasm, patient treated with steroids, bronchodilators and transferred to step down unit. At that time metabolic acidosis was noted as well as lactic acidosis of unclear etiology. Antibiotics were broadened and lactic acidosis and metabolic acidosis cleared within 12 hours. Patient's respiratory status appeared to improve but has continued to require high flow oxygen. Renal function has plateaued with fair output but developing peripheral edema. Consultants have included hematology, surgery and nephrology. At this point patient still has hypoxic respiratory failure with developing interstitial process, difficulty with volume management, acute renal failure. It was felt patient benefit from transfer to a higher level of care both to treat acute issues as well as obtain biopsy and direct further treatment.  Individual issues as below.  1. Diffuse lymphadenopathy, supraclavicular, mediastinal, hilar, axillary, periaortic, pericaval suspicious for lymphoma. Biopsy currently on hold because of clinical condition. 2. Acute renal failure of unclear etiology. No significant change in creatinine last 72 hours but BUN continues to increase. Urine output fair, 950 last 24 hours. Hyperkalemia has resolved. Renal ultrasound was unremarkable as was CT abdomen pelvis from a kidney standpoint.  3. Acute metabolic acidosis, appears resolved. Monitor clinically. 4. Acute hypoxic respiratory failure. No improvement since yesterday but stable off BiPAP. Maybe developing in interstitial process of unclear etiology. 5. COPD exacerbation, bronchospasm. No further improvement. Continue steroids, supplemental oxygen, bronchodilators. 6. Bilateral pleural effusions and ascites on admission. Uncertain etiology and significance. LFTs normal.  Ascites may be secondary to compression from lymphadenopathy, attempt a paracentesis was made 5/1 but there is not enough fluid for sampling. Pleural effusions appear stable since admission. 7. Normocytic, normochromic anemia improved s/p transfusion PRBC.  8. Thrombocytopenia. Slow trend downwards. Possibly  related to methotrexate. Oncology recommended platelet transfusion with irradiated platelets for a platelet count of less than 10,000 and/or active bleeding 9. Hypertension. Stable. 10. Constipation, possible ileus. Asymptomatic. Bowels moving. 11. Severe malnutrition in context of chronic illness, underweight. 12. History of rheumatoid arthritis. Maintained on methotrexate and Plaquenil. Both currently on hold. 13. Tobacco dependence in remission x3 weeks.  14. Mild intrahepatic ductal dilatation: Uncertain etiology and significance. Asymptomatic, normal LFTs. No POA, 4 total children, no wife. Patient requested update family. Updated daughter Narda Amber by telephone this AM.  Consultants:  General surgery  Oncology  Nephrology Procedures:  Transfusion 2 units packed red blood cells 5/1  Attempted paracentesis 5/1, not enough fluid for sampling   2-D echocardiogram Study Conclusions  - Left ventricle: The cavity size was normal. There was mild concentric hypertrophy. Systolic function was normal. The estimated ejection fraction was in the range of 60% to 65%. Wall motion was normal; there were no regional wall motion abnormalities. There was an increased relative contribution of atrial contraction to ventricular filling. Doppler parameters are consistent with abnormal left ventricular relaxation (grade 1 diastolic dysfunction). - Aortic valve: Mild thickening and calcification, consistent with sclerosis. - Mitral valve: Mild regurgitation. - Pulmonary arteries: PA peak pressure: 68mm Hg (S). - Pericardium, extracardiac: A trivial, free-flowing pericardial effusion was identified  circumferential to the heart. There was no evidence of hemodynamic compromise. There was a right pleural effusion. There was a left pleural effusion. Impressions:  - The right ventricular systolic pressure was increased consistent with mild pulmonary hypertension.  Antibiotics:  Levaquin 4/27 >> 4/28  Vancomycin 4/30 >>  Cefepime 4/30 >>  . sodium chloride  250 mL Intravenous Once  . amLODipine  10 mg Oral Daily  . antiseptic oral rinse  15 mL Mouth Rinse BID  . ceFEPime (MAXIPIME) IV  1 g Intravenous Q24H  . cholecalciferol  1,000 Units Oral Daily  . dexamethasone  10 mg Intravenous 3 times per day  . docusate sodium  100 mg Oral BID  . feeding supplement (ENSURE COMPLETE)  237 mL Oral BID BM  . folic acid  1 mg Oral Daily  . furosemide  120 mg Intravenous BID  . gabapentin  300 mg Oral Daily  . ipratropium-albuterol  3 mL Nebulization Q4H  . metoCLOPramide  10 mg Oral TID AC  . omega-3 acid ethyl esters  2 g Oral Daily  . ondansetron  8 mg Oral 3 times per day  . pantoprazole  40 mg Oral Daily  . polyethylene glycol  17 g Oral TID  . senna  1 tablet Oral QHS  . traMADol  50 mg Oral Q12H  . vancomycin  750 mg Intravenous Q24H     Allergies  Allergen Reactions  . Other     "new antibiotic pill, caused a rash" unable to recall name of medication.     The results of significant diagnostics from this hospitalization (including imaging, microbiology, ancillary and laboratory) are listed below for reference.    Significant Diagnostic Studies: Ct Abdomen Pelvis Wo Contrast  05/18/2013   CLINICAL DATA:  3-4 week history of swollen lymph nodes in the neck, axillary regions, and groin  EXAM: CT CHEST WITHOUT CONTRAST; CT ABDOMEN AND PELVIS WITHOUT CONTRAST  TECHNIQUE: Multidetector CT imaging of the chest was performed following the standard protocol without IV contrast.; Multidetector CT imaging of the abdomen and pelvis was performed following the standard protocol without  intravenous contrast.  COMPARISON:  DG CHEST 2 VIEW dated  05/18/2013  FINDINGS: CT scan of the chest: The study is quite limited without IV contrast. The cardiac chambers are normal in size. There are small to moderate-sized bilateral pleural effusions layering posteriorly. There are enlarged supraclavicular lymph nodes bilaterally. There is mild soft tissue fullness in the hilar regions but discrete lymph nodes cannot be clearly delineated. There is likely an enlarged lymph node in the lower right paratracheal region measuring at least 1.5 cm in short axis. There are retrosternal lymph nodes which are poorly defined. One measures 2.3 cm in short axis. There are numerous mildly enlarged axillary lymph nodes. The largest on the right measures 1.3 cm in short axis. At lung window settings there are emphysematous changes bilaterally. There is mild atelectasis versus pneumonia adjacent to the bilateral pleural effusions. No pulmonary parenchymal masses are demonstrated.  The thoracic vertebral bodies are preserved in height. The sternum and ribs exhibit no acute abnormalities.  CT scan of the abdomen and pelvis: The liver is normal in contour. There is mild intrahepatic ductal dilation. There is a subcentimeter hypodensity in the left hepatic lobe on image 60 which exhibits Hounsfield measurement of +6. This likely reflects a cyst. The gallbladder is mildly distended. There is mild gallbladder wall thickening. No stones are evident. The pancreas is grossly normal where visualized. The spleen measures 10.3 cm AP x 9 cm transversely x 9.8 cm in superior to inferior dimension. The caliber of the abdominal aorta is normal. There are in large periaortic and pericaval lymph nodes. These are difficult to separate from adjacent soft tissues. One on image 75 measures 2.1 cm in short axis. The adrenal glands and kidneys are grossly normal. There is increased density within the mesenteric fat, and small amounts of fluid are present  in the paracolic gutters with a moderate amount of fluid present within the pelvis. The urinary bladder is partially distended. The prostate gland and seminal vesicles are grossly normal. There is no inguinal hernia. There is no umbilical hernia.  The non-opacified loops of small and large bowel are grossly normal. No inflammatory changes are suspected. There is a paucity of subcutaneous fat diffusely.  The lumbar vertebral bodies are preserved in height. There are no lytic or blastic bony lesions of the lumbar spine nor of the pelvis.  IMPRESSION: 1. There are enlarged lymph nodes within the supraclavicular regions, mediastinum, hilar regions, and axillary regions within the thorax. Within the abdomen and pelvis there are enlarged periaortic and pericaval lymph nodes. This is worrisome for a lymphoproliferative disorder such as lymphoma. 2. There are small to moderate-sized bilateral pleural effusions. There is a small to moderate amount of ascites. 3. 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 No pulmonary parenchymal masses are demonstrated. Atelectasis or pneumonia is present in both lower lobes adjacent to the pleural fluid collections. There is no evidence of CHF. 4. There is mild intrahepatic ductal dilation of uncertain etiology. The gallbladder exhibits mild wall thickening without evidence of stones. As best as can be determined no acute pancreatic lesion is demonstrated. There is no splenomegaly. No acute abnormality of the kidneys is demonstrated. No acute abnormality of the visualized skeletal structures is demonstrated. 5. The sensitivity of the study is limited overall by the lack of IV and oral contrast. 6. These results were called by telephone at the time of interpretation on 05/18/2013 at 6:07 PM to Dr. Milton Ferguson , who verbally acknowledged these results.   Electronically Signed   By: David  Martinique   On:  05/18/2013 18:11   Dg Chest 2 View  05/21/2013   CLINICAL DATA:  Hypoxia,  shortness of breath, wheezing  EXAM: CHEST  2 VIEW  COMPARISON:  05/18/2013  FINDINGS: Slightly enlarging small to moderate pleural effusions noted bilaterally with bibasilar collapse/consolidation. Background emphysema noted, better appreciated on the CT scan. Right paratracheal prominence remains compatible with mediastinal adenopathy also present on the CT. Trachea is midline. Negative for pneumothorax.  IMPRESSION: Enlarging small to moderate pleural effusions with bibasilar atelectasis/ consolidation.  Background COPD/ emphysema  Mediastinal adenopathy   Electronically Signed   By: Daryll Brod M.D.   On: 05/21/2013 08:44   Dg Chest 2 View  05/18/2013   CLINICAL DATA:  Left adenopathy.  EXAM: CHEST  2 VIEW  COMPARISON:  DG CHEST 2 VIEW dated 04/23/2013; CT ABD - PELV W/ CM dated 11/18/2012  FINDINGS: Mild fullness in the mediastinum noted. Mediastinal adenopathy or mass age cannot be excluded. Borderline cardiomegaly, pulmonary vascularity is normal. Bibasilar pulmonary infiltrates consistent with pneumonia noted. Associated atelectasis. Associated left pleural effusion present.  IMPRESSION: 1. Mediastinal fullness, adenopathy cannot be excluded. 2. Bibasilar prominent pulmonary infiltrates with associated atelectasis. These infiltrates most consistent with pneumonia. 3. Left pleural effusion.   Electronically Signed   By: Marcello Moores  Register   On: 05/18/2013 15:49   US Renal  05/19/2013   CLINICAL DATA:  Acute renal failure, question obstruction secondary to lymphoma  EXAM: RENAL/URINARY TRACT ULTRASOUND COMPLETE  COMPARISON:  CT abdomen and pelvis 05/19/2006  FINDINGS: Right Kidney:  Length: 11.4 cm. Normal cortical thickness. Upper normal cortical echogenicity. No mass, hydronephrosis or shadowing calcification.  Left Kidney:  Length: 11.0 cm. Normal cortical thickness. Upper normal cortical echogenicity. Small cyst at lower pole 2.1 x 1.6 x 1.8 cm, simple features. No solid mass, hydronephrosis shadowing  calcification.  Bladder:  Normally distended.  Ureteral jets were not visualized.  BILATERAL pleural effusions noted.  Small amounts of ascites noted in pelvis and at Morison's pouch.  IMPRESSION: No evidence of urinary tract obstruction.  Simple cyst inferior pole LEFT kidney 2.1 cm greatest size.  Small BILATERAL pleural effusions and minimal ascites.   Electronically Signed   By: Lavonia Dana M.D.   On: 05/19/2013 08:53   US Abdomen Limited  05/22/2013   CLINICAL DATA:  Ascites by CT, assessment for paracentesis  EXAM: LIMITED ABDOMEN ULTRASOUND FOR ASCITES  TECHNIQUE: Limited ultrasound survey for ascites was performed in all four abdominal quadrants.  COMPARISON:  CT abdomen and pelvis 05/18/2013  FINDINGS: Survey imaging of the 4 quadrants was performed to assess for ascites.  Only minimal ascites identified in the lower quadrants bilaterally and adjacent the liver.  Volume of ascites sonographically visualized is insufficient for paracentesis.  IMPRESSION: Minimal ascites, insufficient for paracentesis.   Electronically Signed   By: Lavonia Dana M.D.   On: 05/22/2013 13:55   Dg Chest Port 1 View  05/23/2013   CLINICAL DATA:  Shortness of breath.  Pleural effusions.  EXAM: PORTABLE CHEST - 1 VIEW  COMPARISON:  DG CHEST 2 VIEW dated 05/21/2013; CT CHEST W/O CM dated 05/18/2013 .  FINDINGS: Mild prominence of mediastinum noted consistent with patient's known mediastinal adenopathy. Persistent unchanged pleural effusions are present. Heart size and pulmonary vascularity normal. Developing bilateral pulmonary interstitial infiltrates are present. These changes could be related to pneumonitis, acute congestive heart failure, or process such as drug reaction. Basilar atelectasis. No focal bony abnormality identified.  IMPRESSION: 1. Developing diffuse interstitial prominence. Differential diagnosis includes  pneumonitis, acute congestive heart failure, or process such as drug reaction. 2. Mediastinal adenopathy,  better demonstrated on prior recent CT of 05/18/2013. 3. Persistent unchanged pleural effusions. 4. Heart size and pulmonary vascularity are normal.   Electronically Signed   By: Marcello Moores  Register   On: 05/23/2013 11:07   Dg Abd 2 Views  05/21/2013   CLINICAL DATA:  Abdominal distention  EXAM: ABDOMEN - 2 VIEW  COMPARISON:  CT, 05/18/2013  FINDINGS: No evidence of bowel obstruction. No free air. There are few nonspecific air-fluid levels on the erect view.  Soft tissue planes are poorly defined.  There are bilateral pleural effusions with associated atelectasis.  No significant bony abnormality.  IMPRESSION: 1. No obstruction or free air.  No convincing acute finding. 2. Few nonspecific air-fluid levels within small bowel and colon. This may reflect a minor adynamic ileus. 3. Bilateral pleural effusions with associated atelectasis.   Electronically Signed   By: Lajean Manes M.D.   On: 05/21/2013 09:08    Microbiology: Recent Results (from the past 240 hour(s))  CULTURE, BLOOD (ROUTINE X 2)     Status: None   Collection Time    05/21/13  1:24 PM      Result Value Ref Range Status   Specimen Description BLOOD RIGHT ANTECUBITAL   Final   Special Requests BOTTLES DRAWN AEROBIC AND ANAEROBIC 6CC   Final   Culture NO GROWTH 2 DAYS   Final   Report Status PENDING   Incomplete  CULTURE, BLOOD (ROUTINE X 2)     Status: None   Collection Time    05/21/13  1:28 PM      Result Value Ref Range Status   Specimen Description BLOOD RIGHT ANTECUBITAL   Final   Special Requests BOTTLES DRAWN AEROBIC AND ANAEROBIC 5CC   Final   Culture NO GROWTH 2 DAYS   Final   Report Status PENDING   Incomplete     Labs: Basic Metabolic Panel:  Recent Labs Lab 05/20/13 0531 05/21/13 0528 05/21/13 1447 05/21/13 2110 05/22/13 0346 05/23/13 0501  NA 133* 134* 131* 132* 135* 131*  K 5.0 5.6* 5.8* 5.0 4.8 5.1  CL 100 103 99 99 103 99  CO2 19 18* 16* 18* 18* 20  GLUCOSE 130* 120* 203* 191* 145* 133*  BUN 48* 47*  48* 54* 61* 68*  CREATININE 2.56* 2.30* 2.30* 2.38* 2.40* 2.43*  CALCIUM 7.7* 7.9* 7.9* 8.2* 8.2* 8.3*  PHOS  --  4.5  --   --  4.6  --    Liver Function Tests:  Recent Labs Lab 05/18/13 1334 05/19/13 0506 05/21/13 1447 05/22/13 0346  AST 15 12 19 15   ALT <5 5 6 5   ALKPHOS 167* 148* 172* 131*  BILITOT 0.8 0.5 0.5 0.5  PROT 8.3 6.5 7.0 6.4  ALBUMIN 2.4* 1.8* 2.0* 1.8*    Recent Labs Lab 05/21/13 1447  LIPASE 19   CBC:  Recent Labs Lab 05/18/13 1334 05/19/13 0506 05/19/13 0953 05/20/13 0531 05/21/13 0528 05/22/13 0858 05/23/13 0501  WBC 9.1 7.3  --  7.9 7.9 9.7 16.8*  NEUTROABS 6.3  --  4.4  --   --   --   --   HGB 9.6* 8.3*  --  8.0* 7.8* 7.9* 10.8*  HCT 29.6* 25.1*  --  24.6* 23.8* 23.3* 30.7*  MCV 80.7 80.4  --  81.7 81.2 79.3 79.9  PLT 72* 51*  --  92* 61* 41* 35*   Cardiac Enzymes:  Recent  Labs Lab 05/21/13 0528 05/21/13 1447 05/21/13 2110 05/22/13 0346  CKTOTAL 55  --   --   --   TROPONINI  --  0.32* <0.30 <0.30     Recent Labs  05/21/13 1447  PROBNP 5312.0*    Principal Problem:   Acute respiratory failure with hypoxia Active Problems:   Lymphadenopathy   ARF (acute renal failure)   HTN (hypertension)   Fever   Night sweats   Protein-calorie malnutrition, severe   Rheumatoid arthritis   Metabolic acidosis   COPD exacerbation   Ascites   Pneumonia   Time coordinating discharge: 50 minutes  Signed:  Murray Hodgkins, MD [facility] 05/23/2013, 11:57 AM

## 2013-05-23 NOTE — Progress Notes (Signed)
Report provided to [facility] for transfer to [facility].  Pt updated of transfer.  Pt's daughter, Anderson Malta, updated of transfer schedule.  Pt currently resting with his eyes closed - no c/o pain or discomfort.  Will continue to closely monitor.

## 2013-05-23 NOTE — Progress Notes (Signed)
Bedside update provided to [facility] - pt transported via stretcher - no c/o pain or discomfort and no acute changes upon transfer.  All pt's belongings bagged and tagged with pt's name and taken with pt to [facility] via [place].  Pt's daughter, Anderson Malta, called - provided update of pt's departure time from [facility].    Report given to Santiago Glad, Therapist, sports at [facility].

## 2013-05-23 NOTE — Progress Notes (Signed)
Relayed to Sarajane Jews, MD pt's daughter, Anderson Malta, would like to speak with him sometime this morning regarding pt's treatment plan.    Also notified MD regarding pt's increase in O2 requirements to maintain O2 sat greater than 93%.  Notified by RN asst that pt's O2 sat to low 70s upon standing and transferring to the [facility].  Ventimask remained in place and O2 increased with no changes to pt's O2 sat.  RT contacted and updated of pt's current status.  Advised to place pt on a non-re breather mask - pt responded well to non-re breather and is current O2 sat is 95-98%.  Upon any exertion, pt's O2 sat drops almost immediately to low 80s.  Pt tolerating non-re breather well, continues breathing slowly, is calm and appropriate.  Pt educated on the importance of continuing his slow, deep breaths and positive reinforcement provided regarding his calmness.    Pt currently resting with his eyes closed.  Will continue to closely monitor.

## 2013-05-23 NOTE — Progress Notes (Signed)
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 Admitted for further evaluation of diffuse lymphadenopathy concerning for lymphoma.  Assessment/Plan: 1. Diffuse lymphadenopathy, supraclavicular, mediastinal, hilar, axillary, periaortic, pericaval suspicious for lymphoma. Biopsy planned when clinical condition improved. 2. Acute renal failure of unclear etiology. No significant change overnight. Hyperkalemia has resolved, urine output fair. Renal ultrasound was unremarkable as was CT abdomen pelvis from a kidney standpoint.  3. Acute metabolic acidosis, appears resolved. Suspect secondary to renal failure. Monitor clinically. 4. Acute hypoxic respiratory failure. No improvement since yesterday but stable off BiPAP. Suspect multifactorial, COPD, possible pneumonia.  5. COPD exacerbation. No change overnight. Continue steroids, supplemental oxygen, bronchodilators. 6. Bilateral pleural effusions and ascites on admission. Uncertain etiology and significance. LFTs normal. Ascites may be secondary to compression from lymphadenopathy. 7. Normocytic, normochromic anemia improved s/p transfusion PRBC.  8. Thrombocytopenia. Slow trend downwards. Possibly related to methotrexate. Recommend platelet transfusion with irradiated platelets for a platelet count of less than 10,000 and/or active bleeding 9. Hypertension. Stable. 10. Constipation, possible ileus. Asymptomatic. Bowels moving. 11. Severe malnutrition in context of chronic illness, underweight. 12. History of rheumatoid arthritis. Maintained on methotrexate and Plaquenil. Both currently on hold. 13. Tobacco dependence in remission x3 weeks.  14. Mild intrahepatic ductal dilatation: Uncertain etiology and  significance. Asymptomatic, normal LFTs.   Remains on high flow oxygen but has not required BiPAP again. Little pulmonary reserve. Continue empiric antibiotics, steroids, bronchodilators.  Repeat chest x-ray to followup, repeat ABG.  Think patient would benefit from transfer to higher level of care. Patient requests [facility], will explore.  Biopsy on hold for now until clinical condition improves.  Continue broad-spectrum antibiotics cefepime vancomycin for possible pneumonia.  No POA, 4 total children, no wife. Patient requested update family. Updated daughter Narda Amber by telephone this AM.  Code Status: full code DVT prophylaxis: SCDs (thrombocytopenia) Family Communication: none present Disposition Plan: home when improved  Murray Hodgkins, MD  [facility]  Pager [phone] If 7PM-7AM, please contact night-coverage at [email or web address], password [facility] 05/23/2013, 9:23 AM  LOS: 5 days   Consultants:  General surgery  Oncology  Nephrology  Procedures:  Transfusion 2 units packed red blood cells 5/1  Antibiotics:  Levaquin 4/27 >> 4/28  Vancomycin 4/30 >>  Cefepime 4/30 >>  HPI/Subjective: Slept very well last night. Large bowel movements yesterday. No chest pain. No abdominal pain. No joint pain. Short of breath this morning after eating. Discussed with RN, no new issues but has very little pulmonary reserve.  Objective: Filed Vitals:   05/23/13 0500 05/23/13 0600 05/23/13 0729 05/23/13 0800  BP: 128/79 114/69    Pulse: 93 86    Temp:    96.5 F (35.8 C)  TempSrc:    Axillary  Resp: 12 11    Height:      Weight: 56.7 kg (125 lb)     SpO2: 93% 93% 91%     Intake/Output Summary (Last 24 hours) at 05/23/13 0923 Last data filed at 05/23/13 0600  Gross per 24 hour  Intake 2433.33 ml  Output    950 ml  Net 1483.33 ml     Filed Weights   05/21/13 0806 05/22/13 0500 05/23/13 0500  Weight: 56.609 kg (124 lb 12.8 oz) 57 kg (125 lb 10.6 oz) 56.7 kg (125 lb)  Exam:   Afebrile, hemodynamically stable. Remains on Venturi mask at 45%.  Gen. Appears calm, mildly uncomfortable. Nontoxic but dyspneic.  Eyes. Pupils equal, round. Grossly normal lids and conjunctiva.  ENT. Lips and tongue appear unremarkable.  Neck. No lymphadenopathy or masses. No thyromegaly. No JVD.  Cardiovascular. Tachycardic, regular rhythm. No murmur, rub or gallop. 1+ bilateral lower extremity edema  Respiratory. Fair air movement, bilateral expiratory wheezes, somewhat prolonged expiratory phase. Able to speak in full sentences. Mildly dyspneic.  Abdomen distended, nontender.  Skin appears grossly unremarkable.  Data Reviewed:  Weight stable. Urine output: 950.  I/O +6200 since admission  Creatinine 2.3 >> 2.40 >> 2.43. BUN 54  >> 61 >> 68. Phosphorus normal. CO2 now normal, 20.  Troponins negative.  procalcitonin remains high  Hemoglobin improved after transfusion, to 20. CBC 16.8 (on Decadron).  Platelet count lower, 35.  Scheduled Meds: . sodium chloride  250 mL Intravenous Once  . amLODipine  10 mg Oral Daily  . antiseptic oral rinse  15 mL Mouth Rinse BID  . ceFEPime (MAXIPIME) IV  1 g Intravenous Q24H  . cholecalciferol  1,000 Units Oral Daily  . dexamethasone  10 mg Intravenous 3 times per day  . docusate sodium  100 mg Oral BID  . feeding supplement (ENSURE COMPLETE)  237 mL Oral BID BM  . folic acid  1 mg Oral Daily  . furosemide  100 mg Intravenous BID  . gabapentin  300 mg Oral Daily  . ipratropium-albuterol  3 mL Nebulization Q4H  . metoCLOPramide  10 mg Oral TID AC  . omega-3 acid ethyl esters  2 g Oral Daily  . ondansetron  8 mg Oral 3 times per day  . pantoprazole  40 mg Oral Daily  . polyethylene glycol  17 g Oral TID  . senna  1 tablet Oral QHS  . traMADol  50 mg Oral Q12H  . vancomycin  750 mg Intravenous Q24H   Continuous Infusions: .  sodium bicarbonate infusion 1/4 NS 1000 mL 100 mL/hr at 05/22/13 2000    Principal  Problem:   Lymphadenopathy Active Problems:   ARF (acute renal failure)   HTN (hypertension)   Fever   Night sweats   Protein-calorie malnutrition, severe   Rheumatoid arthritis   Metabolic acidosis   Acute respiratory failure with hypoxia   COPD exacerbation   Ascites   Pneumonia   Time spent 45 minutes greater than 50% in counseling and coordination of care.

## 2013-05-26 LAB — UIFE/LIGHT CHAINS/TP QN, 24-HR UR
ALBUMIN, U: DETECTED
ALPHA 1 UR: DETECTED — AB
Alpha 2, Urine: DETECTED — AB
Beta, Urine: DETECTED — AB
FREE LAMBDA LT CHAINS, UR: 6.41 mg/dL — AB (ref 0.02–0.67)
Free Kappa Lt Chains,Ur: 57.8 mg/dL — ABNORMAL HIGH (ref 0.14–2.42)
Free Kappa/Lambda Ratio: 9.02 ratio (ref 2.04–10.37)
Free Lambda Excretion/Day: 36.86 mg/d
Free Lt Chn Excr Rate: 332.35 mg/d
Gamma Globulin, Urine: DETECTED — AB
TOTAL PROTEIN, URINE-UR/DAY: 386 mg/d — AB (ref 10–140)
Time: 24 hours
Total Protein, Urine: 67.1 mg/dL
Volume, Urine: 575 mL

## 2013-05-26 LAB — CULTURE, BLOOD (ROUTINE X 2)
CULTURE: NO GROWTH
Culture: NO GROWTH

## 2013-06-11 LAB — MISCELLANEOUS TEST: Miscellaneous Test: 85214

## 2013-06-12 ENCOUNTER — Other Ambulatory Visit (HOSPITAL_COMMUNITY): Payer: Self-pay | Admitting: Hematology and Oncology

## 2013-07-06 ENCOUNTER — Inpatient Hospital Stay (HOSPITAL_COMMUNITY)
Admission: EM | Admit: 2013-07-06 | Discharge: 2013-07-07 | DRG: 682 | Disposition: A | Discharge status: Other Acute Inpatient Hospital | Payer: Medicare Other | Attending: Internal Medicine | Admitting: Internal Medicine

## 2013-07-06 ENCOUNTER — Non-Acute Institutional Stay (SKILLED_NURSING_FACILITY): Payer: Medicare Other | Admitting: Internal Medicine

## 2013-07-06 ENCOUNTER — Emergency Department (HOSPITAL_COMMUNITY): Payer: Medicare Other

## 2013-07-06 ENCOUNTER — Other Ambulatory Visit: Payer: Self-pay | Admitting: *Deleted

## 2013-07-06 ENCOUNTER — Encounter (HOSPITAL_COMMUNITY): Payer: Self-pay | Admitting: Emergency Medicine

## 2013-07-06 DIAGNOSIS — Z79899 Other long term (current) drug therapy: Secondary | ICD-10-CM

## 2013-07-06 DIAGNOSIS — N179 Acute kidney failure, unspecified: Secondary | ICD-10-CM | POA: Diagnosis present

## 2013-07-06 DIAGNOSIS — I319 Disease of pericardium, unspecified: Secondary | ICD-10-CM

## 2013-07-06 DIAGNOSIS — Z87891 Personal history of nicotine dependence: Secondary | ICD-10-CM | POA: Diagnosis not present

## 2013-07-06 DIAGNOSIS — D696 Thrombocytopenia, unspecified: Secondary | ICD-10-CM | POA: Diagnosis present

## 2013-07-06 DIAGNOSIS — I120 Hypertensive chronic kidney disease with stage 5 chronic kidney disease or end stage renal disease: Secondary | ICD-10-CM | POA: Diagnosis present

## 2013-07-06 DIAGNOSIS — D649 Anemia, unspecified: Secondary | ICD-10-CM | POA: Diagnosis present

## 2013-07-06 DIAGNOSIS — N186 End stage renal disease: Secondary | ICD-10-CM

## 2013-07-06 DIAGNOSIS — J9601 Acute respiratory failure with hypoxia: Secondary | ICD-10-CM | POA: Diagnosis present

## 2013-07-06 DIAGNOSIS — J4489 Other specified chronic obstructive pulmonary disease: Secondary | ICD-10-CM | POA: Diagnosis present

## 2013-07-06 DIAGNOSIS — B1920 Unspecified viral hepatitis C without hepatic coma: Secondary | ICD-10-CM | POA: Diagnosis present

## 2013-07-06 DIAGNOSIS — M069 Rheumatoid arthritis, unspecified: Secondary | ICD-10-CM | POA: Diagnosis present

## 2013-07-06 DIAGNOSIS — J449 Chronic obstructive pulmonary disease, unspecified: Secondary | ICD-10-CM

## 2013-07-06 DIAGNOSIS — J810 Acute pulmonary edema: Secondary | ICD-10-CM | POA: Diagnosis present

## 2013-07-06 DIAGNOSIS — I313 Pericardial effusion (noninflammatory): Secondary | ICD-10-CM

## 2013-07-06 DIAGNOSIS — R599 Enlarged lymph nodes, unspecified: Secondary | ICD-10-CM

## 2013-07-06 DIAGNOSIS — R197 Diarrhea, unspecified: Secondary | ICD-10-CM | POA: Diagnosis present

## 2013-07-06 DIAGNOSIS — I509 Heart failure, unspecified: Secondary | ICD-10-CM | POA: Diagnosis present

## 2013-07-06 DIAGNOSIS — I150 Renovascular hypertension: Secondary | ICD-10-CM | POA: Insufficient documentation

## 2013-07-06 DIAGNOSIS — Z992 Dependence on renal dialysis: Secondary | ICD-10-CM | POA: Diagnosis not present

## 2013-07-06 DIAGNOSIS — R591 Generalized enlarged lymph nodes: Secondary | ICD-10-CM | POA: Diagnosis present

## 2013-07-06 DIAGNOSIS — J9600 Acute respiratory failure, unspecified whether with hypoxia or hypercapnia: Secondary | ICD-10-CM | POA: Diagnosis present

## 2013-07-06 DIAGNOSIS — I3139 Other pericardial effusion (noninflammatory): Secondary | ICD-10-CM | POA: Insufficient documentation

## 2013-07-06 DIAGNOSIS — I10 Essential (primary) hypertension: Secondary | ICD-10-CM | POA: Diagnosis present

## 2013-07-06 HISTORY — DX: Chronic obstructive pulmonary disease, unspecified: J44.9

## 2013-07-06 HISTORY — DX: Heart failure, unspecified: I50.9

## 2013-07-06 HISTORY — DX: Disorder of kidney and ureter, unspecified: N28.9

## 2013-07-06 LAB — CBC WITH DIFFERENTIAL/PLATELET
Basophils Absolute: 0.1 10*3/uL (ref 0.0–0.1)
Basophils Relative: 0 % (ref 0–1)
Eosinophils Absolute: 0.1 10*3/uL (ref 0.0–0.7)
Eosinophils Relative: 1 % (ref 0–5)
HCT: 27 % — ABNORMAL LOW (ref 39.0–52.0)
HEMOGLOBIN: 8.5 g/dL — AB (ref 13.0–17.0)
Lymphocytes Relative: 21 % (ref 12–46)
Lymphs Abs: 2.9 10*3/uL (ref 0.7–4.0)
MCH: 27.1 pg (ref 26.0–34.0)
MCHC: 31.5 g/dL (ref 30.0–36.0)
MCV: 86 fL (ref 78.0–100.0)
MONOS PCT: 4 % (ref 3–12)
Monocytes Absolute: 0.6 10*3/uL (ref 0.1–1.0)
NEUTROS ABS: 10.1 10*3/uL — AB (ref 1.7–7.7)
Neutrophils Relative %: 74 % (ref 43–77)
PLATELETS: 103 10*3/uL — AB (ref 150–400)
RBC: 3.14 MIL/uL — AB (ref 4.22–5.81)
RDW: 19 % — ABNORMAL HIGH (ref 11.5–15.5)
WBC: 13.7 10*3/uL — ABNORMAL HIGH (ref 4.0–10.5)

## 2013-07-06 LAB — BASIC METABOLIC PANEL
BUN: 67 mg/dL — ABNORMAL HIGH (ref 6–23)
CALCIUM: 7.9 mg/dL — AB (ref 8.4–10.5)
CO2: 21 mEq/L (ref 19–32)
Chloride: 99 mEq/L (ref 96–112)
Creatinine, Ser: 4.59 mg/dL — ABNORMAL HIGH (ref 0.50–1.35)
GFR, EST AFRICAN AMERICAN: 14 mL/min — AB (ref >90)
GFR, EST NON AFRICAN AMERICAN: 12 mL/min — AB (ref >90)
Glucose, Bld: 76 mg/dL (ref 70–99)
POTASSIUM: 4.7 meq/L (ref 3.7–5.3)
SODIUM: 138 meq/L (ref 137–147)

## 2013-07-06 LAB — CBC
HEMATOCRIT: 22.2 % — AB (ref 39.0–52.0)
Hemoglobin: 7.2 g/dL — ABNORMAL LOW (ref 13.0–17.0)
MCH: 27.7 pg (ref 26.0–34.0)
MCHC: 32.4 g/dL (ref 30.0–36.0)
MCV: 85.4 fL (ref 78.0–100.0)
Platelets: 98 10*3/uL — ABNORMAL LOW (ref 150–400)
RBC: 2.6 MIL/uL — AB (ref 4.22–5.81)
RDW: 18.7 % — ABNORMAL HIGH (ref 11.5–15.5)
WBC: 15.7 10*3/uL — ABNORMAL HIGH (ref 4.0–10.5)

## 2013-07-06 LAB — RENAL FUNCTION PANEL
Albumin: 1.9 g/dL — ABNORMAL LOW (ref 3.5–5.2)
BUN: 67 mg/dL — AB (ref 6–23)
CO2: 24 meq/L (ref 19–32)
CREATININE: 4.47 mg/dL — AB (ref 0.50–1.35)
Calcium: 7.6 mg/dL — ABNORMAL LOW (ref 8.4–10.5)
Chloride: 100 mEq/L (ref 96–112)
GFR calc Af Amer: 15 mL/min — ABNORMAL LOW (ref >90)
GFR, EST NON AFRICAN AMERICAN: 13 mL/min — AB (ref >90)
Glucose, Bld: 99 mg/dL (ref 70–99)
Phosphorus: 6.1 mg/dL — ABNORMAL HIGH (ref 2.3–4.6)
Potassium: 4.5 mEq/L (ref 3.7–5.3)
Sodium: 139 mEq/L (ref 137–147)

## 2013-07-06 LAB — MRSA PCR SCREENING: MRSA BY PCR: NEGATIVE

## 2013-07-06 LAB — TROPONIN I

## 2013-07-06 LAB — HEPATITIS B SURFACE ANTIGEN: Hepatitis B Surface Ag: NEGATIVE

## 2013-07-06 MED ORDER — METOPROLOL TARTRATE 25 MG PO TABS
25.0000 mg | ORAL_TABLET | Freq: Two times a day (BID) | ORAL | Status: DC
  Administered 2013-07-06: 25 mg via ORAL
  Filled 2013-07-06 (×3): qty 1

## 2013-07-06 MED ORDER — POLYVINYL ALCOHOL 1.4 % OP SOLN
1.0000 [drp] | OPHTHALMIC | Status: DC | PRN
  Filled 2013-07-06: qty 15

## 2013-07-06 MED ORDER — OMEGA-3-ACID ETHYL ESTERS 1 G PO CAPS
1.0000 g | ORAL_CAPSULE | Freq: Every day | ORAL | Status: DC
  Administered 2013-07-07: 1 g via ORAL
  Filled 2013-07-06: qty 1

## 2013-07-06 MED ORDER — VITAMIN D3 25 MCG (1000 UNIT) PO TABS
1000.0000 [IU] | ORAL_TABLET | Freq: Every day | ORAL | Status: DC
  Administered 2013-07-07: 1000 [IU] via ORAL
  Filled 2013-07-06: qty 1

## 2013-07-06 MED ORDER — BUDESONIDE-FORMOTEROL FUMARATE 80-4.5 MCG/ACT IN AERO
2.0000 | INHALATION_SPRAY | Freq: Two times a day (BID) | RESPIRATORY_TRACT | Status: DC
  Administered 2013-07-06 – 2013-07-07 (×3): 2 via RESPIRATORY_TRACT
  Filled 2013-07-06 (×3): qty 6.9

## 2013-07-06 MED ORDER — SODIUM CHLORIDE 0.9 % IJ SOLN: 3.0000 mL | Freq: Two times a day (BID) | INTRAMUSCULAR | Status: DC

## 2013-07-06 MED ORDER — ALBUTEROL SULFATE (2.5 MG/3ML) 0.083% IN NEBU
3.0000 mL | INHALATION_SOLUTION | Freq: Four times a day (QID) | RESPIRATORY_TRACT | Status: DC | PRN
  Administered 2013-07-06 – 2013-07-07 (×4): 3 mL via RESPIRATORY_TRACT
  Filled 2013-07-06 (×3): qty 3

## 2013-07-06 MED ORDER — TRAMADOL HCL 50 MG PO TABS: 50.0000 mg | ORAL_TABLET | Freq: Two times a day (BID) | ORAL | Status: DC | PRN

## 2013-07-06 MED ORDER — FUROSEMIDE 80 MG PO TABS
80.0000 mg | ORAL_TABLET | ORAL | Status: DC
  Administered 2013-07-07: 80 mg via ORAL
  Filled 2013-07-06: qty 1

## 2013-07-06 MED ORDER — HYDROXYCHLOROQUINE SULFATE 200 MG PO TABS
200.0000 mg | ORAL_TABLET | Freq: Every day | ORAL | Status: DC
  Filled 2013-07-06: qty 1

## 2013-07-06 MED ORDER — SODIUM CHLORIDE 0.9 % IJ SOLN
3.0000 mL | Freq: Two times a day (BID) | INTRAMUSCULAR | Status: DC
Start: 2013-07-06 — End: 2013-07-08
  Administered 2013-07-06: 3 mL via INTRAVENOUS

## 2013-07-06 MED ORDER — POLYETHYLENE GLYCOL 3350 17 G PO PACK: 17.0000 g | PACK | Freq: Every day | ORAL | Status: DC | PRN

## 2013-07-06 MED ORDER — PANTOPRAZOLE SODIUM 40 MG PO TBEC
40.0000 mg | DELAYED_RELEASE_TABLET | Freq: Every day | ORAL | Status: DC
Start: 2013-07-06 — End: 2013-07-08
  Administered 2013-07-07: 40 mg via ORAL
  Filled 2013-07-06: qty 1

## 2013-07-06 MED ORDER — AMLODIPINE BESYLATE 10 MG PO TABS
10.0000 mg | ORAL_TABLET | Freq: Every day | ORAL | Status: DC
  Administered 2013-07-06: 10 mg via ORAL
  Filled 2013-07-06 (×2): qty 1

## 2013-07-06 MED ORDER — TRAMADOL HCL 50 MG PO TABS: 50.0000 mg | ORAL_TABLET | Freq: Four times a day (QID) | ORAL | Status: DC | PRN

## 2013-07-06 MED ORDER — TRAMADOL HCL 50 MG PO TABS: ORAL_TABLET | ORAL | Status: AC

## 2013-07-06 MED ORDER — IPRATROPIUM-ALBUTEROL 0.5-2.5 (3) MG/3ML IN SOLN
3.0000 mL | RESPIRATORY_TRACT | Status: AC
  Administered 2013-07-06 – 2013-07-07 (×3): 3 mL via RESPIRATORY_TRACT
  Filled 2013-07-06 (×3): qty 3

## 2013-07-06 MED ORDER — ALBUTEROL SULFATE HFA 108 (90 BASE) MCG/ACT IN AERS
2.0000 | INHALATION_SPRAY | Freq: Once | RESPIRATORY_TRACT | Status: AC
  Administered 2013-07-06: 2 via RESPIRATORY_TRACT
  Filled 2013-07-06: qty 6.7

## 2013-07-06 MED ORDER — CALCIUM CARBONATE ANTACID 500 MG PO CHEW
2.0000 | CHEWABLE_TABLET | Freq: Three times a day (TID) | ORAL | Status: DC
  Administered 2013-07-07 (×2): 400 mg via ORAL
  Filled 2013-07-06 (×4): qty 2

## 2013-07-06 MED ORDER — FOLIC ACID 1 MG PO TABS
1.0000 mg | ORAL_TABLET | Freq: Every day | ORAL | Status: DC
  Administered 2013-07-07: 1 mg via ORAL
  Filled 2013-07-06: qty 1

## 2013-07-06 MED ORDER — GABAPENTIN 300 MG PO CAPS
300.0000 mg | ORAL_CAPSULE | Freq: Every day | ORAL | Status: DC
  Administered 2013-07-06: 300 mg via ORAL
  Filled 2013-07-06 (×2): qty 1

## 2013-07-06 MED ORDER — NYSTATIN 100000 UNIT/ML MT SUSP
5.0000 mL | Freq: Four times a day (QID) | OROMUCOSAL | Status: DC
  Administered 2013-07-06 – 2013-07-07 (×3): 500000 [IU] via ORAL
  Filled 2013-07-06 (×5): qty 5

## 2013-07-06 MED ORDER — CARBOXYMETHYLCELLULOSE SODIUM 1 % OP SOLN: 1.0000 [drp] | Freq: Four times a day (QID) | OPHTHALMIC | Status: DC

## 2013-07-06 MED ORDER — SODIUM CHLORIDE 0.9 % IJ SOLN: 3.0000 mL | INTRAMUSCULAR | Status: DC | PRN

## 2013-07-06 MED ORDER — HEPARIN SODIUM (PORCINE) 5000 UNIT/ML IJ SOLN
5000.0000 [IU] | Freq: Three times a day (TID) | INTRAMUSCULAR | Status: DC
  Administered 2013-07-06 – 2013-07-07 (×2): 5000 [IU] via SUBCUTANEOUS
  Filled 2013-07-06 (×5): qty 1

## 2013-07-06 MED ORDER — FERROUS SULFATE 325 (65 FE) MG PO TABS
325.0000 mg | ORAL_TABLET | Freq: Three times a day (TID) | ORAL | Status: DC
  Administered 2013-07-07 (×2): 325 mg via ORAL
  Filled 2013-07-06 (×4): qty 1

## 2013-07-06 MED ORDER — SODIUM CHLORIDE 0.9 % IV SOLN: 250.0000 mL | INTRAVENOUS | Status: DC | PRN

## 2013-07-06 MED ORDER — IPRATROPIUM-ALBUTEROL 0.5-2.5 (3) MG/3ML IN SOLN
3.0000 mL | RESPIRATORY_TRACT | Status: DC
  Filled 2013-07-06: qty 3

## 2013-07-06 NOTE — H&P (Signed)
Date: 07/06/2013               Patient Name:  Gabriel Davenport MRN: [id number]  DOB: 01/10/1951 Age / Sex: 63 y.o., male   PCP: Provider Not In System         Medical Service: Internal Medicine Teaching Service         Attending Physician: Dr. Bartholomew Crews, MD    First Contact: Dr. Joni Reining Pager: [phone]  Second Contact: Dr. Randell Loop Pager: [phone]       After Hours (After 5p/  First Contact Pager: [phone]  weekends / holidays): Second Contact Pager: [phone]   Chief Complaint: Hypoxia  History of Present Illness: Gabriel Davenport is a 63 yo Male with extensive PMH.  Most recently he has been worked up at [facility] and [facility] for lymphadenopathy, ARF (?Vanc vs parainfluenza, vs RA), PNA, Pleural Effusions (s/p left thoracentesis with 800cc removed), Pericardial effusion (s/p pericardiac window and drain>>> removed 6/8) .  He was discharged to [facility] from [facility] on 6/12.  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  He has yet to follow up with [facility] or Nephrology about this result.  Per ED notes patient was sent from [facility] this AM due to hypoxia at this SNF (85% on RA).  Then per EMS Oxygen sat was 85% on 6L [place], and noted to be 97% on 2L on arrival to the ED. Currently his daughter is at his bedside.  He reports that that he has progressively been getting SOB since his discharge on Friday from [facility].  He does note an associated cough but reports that was present at [facility].  Otherwise he denies any fever, nausea, vomiting, constipation, abdominal pain, chest pain, dizziness, fatigue.  He does admit one episode of watery diarrhea in our ED.  Otherwise he has no complaints. Daughter reports that [facility] did not given [name] any medications on Friday, gave him his medications on Saturday after she asked about it.  She does not want him returning to [facility].  Meds: Current Facility-Administered Medications  Medication Dose Route Frequency Provider Last Rate Last Dose  . albuterol (PROVENTIL HFA;VENTOLIN HFA) 108 (90 BASE) MCG/ACT inhaler 2 puff  2 puff Inhalation Once Nat Christen, MD       Current Outpatient Prescriptions  Medication Sig Dispense Refill  . albuterol (PROVENTIL HFA;VENTOLIN HFA) 108 (90 BASE) MCG/ACT inhaler Inhale 1-2 puffs into the lungs every 6 (six) hours as needed for wheezing or shortness of breath.  1 Inhaler  0  . budesonide-formoterol (SYMBICORT) 80-4.5 MCG/ACT inhaler Inhale 2 puffs into the lungs 2 (two) times daily.      . calcium carbonate (TUMS - DOSED IN MG ELEMENTAL CALCIUM) 500 MG chewable tablet Chew 2 tablets by mouth 3 (three) times daily with meals.      . carboxymethylcellulose 1 % ophthalmic solution Place 1 drop into both eyes 4 (four) times daily.      . cholecalciferol (VITAMIN D) 1000 UNITS tablet Take 1,000 Units by mouth daily.      . ferrous sulfate 325 (65 FE) MG tablet Take 325 mg by mouth 3 (three) times daily with meals.      . folic acid (FOLVITE) 1 MG tablet Take 1 mg by mouth daily.      . furosemide (LASIX) 80 MG tablet Take 80 mg by mouth 4 (four) times  a week. Take on non dialysis days Sunday,Tues,Thrus & sat      . gabapentin (NEURONTIN) 300 MG capsule Take 300 mg by mouth at bedtime.       . hydroxychloroquine (PLAQUENIL) 200 MG tablet Take 200 mg by mouth daily.      . metoprolol tartrate (LOPRESSOR) 25 MG tablet Take 25 mg by mouth 2 (two) times daily.      Marland Kitchen nystatin (MYCOSTATIN) 100000 UNIT/ML suspension Take 5 mLs by mouth 4 (four) times daily. For 10 days      . Omega-3 Fatty Acids (FISH OIL) 1000 MG CAPS Take 2,000 mg by mouth daily.      . polyethylene glycol (MIRALAX / GLYCOLAX) packet Take 17 g by mouth daily as needed for mild constipation.      . traMADol (ULTRAM) 50 MG tablet Take one tablet by mouth four times daily as needed for pain  120 tablet  5  . amLODipine  (NORVASC) 10 MG tablet Take 10 mg by mouth daily.      Marland Kitchen omeprazole (PRILOSEC) 20 MG capsule Take 20 mg by mouth daily.        Allergies: Allergies as of 07/06/2013 - Review Complete 07/06/2013  Allergen Reaction Noted  . Azithromycin Rash and Shortness Of Breath 07/06/2013  . Other  03/26/2013   Past Medical History  Diagnosis Date  . Arthritis   . Hypertension   . Stomach problems   . Gastroenteritis   . Rheumatoid arthritis 05/19/2013  . COPD (chronic obstructive pulmonary disease)   . CHF (congestive heart failure)   . Renal disorder    History reviewed. No pertinent past surgical history. No family history on file. History   Social History  . Marital Status: Widowed    Spouse Name: N/A    Number of Children: N/A  . Years of Education: N/A   Occupational History  . Not on file.   Social History Main Topics  . Smoking status: Former Smoker    Types: Cigarettes    Quit date: 04/27/2013  . Smokeless tobacco: Former Systems developer    Quit date: 04/25/2013  . Alcohol Use: No     Comment: beer twice a week approx 1-2  . Drug Use: No     Comment: history  . Sexual Activity: Yes   Other Topics Concern  . Not on file   Social History Narrative  . No narrative on file    Review of Systems: Review of Systems  Constitutional: Positive for chills. Negative for fever.  Eyes: Negative for blurred vision.  Respiratory: Positive for cough and shortness of breath. Negative for sputum production.   Cardiovascular: Negative for chest pain and leg swelling.  Gastrointestinal: Positive for diarrhea. Negative for nausea, vomiting, abdominal pain and constipation.  Genitourinary: Negative for dysuria and frequency.  Musculoskeletal: Negative for falls and myalgias.  Neurological: Negative for dizziness, focal weakness, weakness and headaches.  Psychiatric/Behavioral: Negative for depression.  All other systems reviewed and are negative.    Physical Exam: Blood pressure 112/71,  pulse 90, temperature 97.4 F (36.3 C), temperature source Oral, resp. rate 15, SpO2 98.00%. Physical Exam  Nursing note and vitals reviewed. Constitutional: He is oriented to person, place, and time. No distress.  HENT:  Head: Normocephalic and atraumatic.  Eyes: EOM are normal. Pupils are equal, round, and reactive to light.  Cardiovascular: Normal rate, regular rhythm, normal heart sounds and intact distal pulses.   No murmur heard. Pulmonary/Chest: Effort normal. No respiratory distress. He  has no wheezes. He has rales (bilateral to middle lung fields). He exhibits no tenderness.  Abdominal: Soft. Bowel sounds are normal. He exhibits distension. There is no tenderness. There is no rebound and no guarding.  + fluid wave  Musculoskeletal: He exhibits edema (2+ edema to knee b/l).  Neurological: He is alert and oriented to person, place, and time.  Skin: Skin is warm and dry.     Lab results: Basic Metabolic Panel:  Recent Labs  07/06/13 1354  NA 138  K 4.7  CL 99  CO2 21  GLUCOSE 76  BUN 67*  CREATININE 4.59*  CALCIUM 7.9*   Liver Function Tests: No results found for this basename: AST, ALT, ALKPHOS, BILITOT, PROT, ALBUMIN,  in the last 72 hours No results found for this basename: LIPASE, AMYLASE,  in the last 72 hours No results found for this basename: AMMONIA,  in the last 72 hours CBC:  Recent Labs  07/06/13 1354  WBC 13.7*  NEUTROABS 10.1*  HGB 8.5*  HCT 27.0*  MCV 86.0  PLT 103*   Cardiac Enzymes:  Recent Labs  07/06/13 1354  TROPONINI <0.30     Imaging results:  Dg Chest Port 1 View  07/06/2013   CLINICAL DATA:  Shortness of breath. With history of CHF and COPD and acute renal failure  EXAM: PORTABLE CHEST - 1 VIEW  COMPARISON:  Portable chest of May 23, 2013  FINDINGS: The lung volumes are decreased. The interstitial markings are increased bilaterally and are more conspicuous today on the right. Obscuration of the hemidiaphragms persists. The  cardiac silhouette is mildly enlarged where visualized. The pulmonary vascularity is prominent. The dialysis catheter tip lies at the junction of the SVC with the right atrium.  IMPRESSION: There is pulmonary interstitial edema with bilateral pleural effusions likely indicating CHF. There has been slight deterioration since the previous study.   Electronically Signed   By: David  Martinique   On: 07/06/2013 13:06    Other results: EKG: NSR, normal axis, nonspecific ST changes, unchanged from previous  Assessment & Plan by Problem: Acute Respiratory Failure   Patient appears volume overloaded on exam, has evidence of pulmonary edema and pleural effusions on CXR.  Patient now on HD due to Acute renal failure.  Patient also has history of COPD with no wheezing on exam. - Admit to Telemetry - Nephrology consulted>>> plan for HD tonight - Supplemental O2 to maintain Ox sat >92% - Duoneb Q4PRN  ARF requiring HD  - Nephrology following - To go for HD tonight - Patient may be transferred to [facility] tomorrow if they accept.  Lymphadenopathy>>> ?Castleman's disease -Being worked up by [facility].  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  Patient has had HIV ab testing at [facility] and at [facility] in May which were negative. ? 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  Although I wonder if the patient's Rheumatoid Arthritis may be confounding these results. - Currently being worked up by Hematology at [facility], will defer further workup to their expertise.  HTN:  - Continue metoprolol   Rheumatoid Arthritis - Methotrexate discontinued by [facility] as patient is on HD.  DVT PPx: Heparin Storla Diet: Renal Code Status: Full Dispo: Disposition is deferred at this time, awaiting improvement of current medical problems. Anticipated discharge in approximately 2 day(s).   The patient does have a current PCP (Provider Not In [place]) and does not need an [facility] hospital follow-up  appointment after discharge.  The patient does not have transportation limitations that hinder transportation to clinic appointments.  Signed: Joni Reining, DO 07/06/2013, 4:33 PM

## 2013-07-06 NOTE — ED Provider Notes (Signed)
CSN: [id number]     Arrival date & time 07/06/13  1137 History   First MD Initiated Contact with Patient 07/06/13 1149     Chief Complaint  Patient presents with  . Shortness of Breath     (Consider location/radiation/quality/duration/timing/severity/associated sxs/prior Treatment) HPI..... patient transferred from [place] facility for low pulse ox today. Patient just started hemodialysis 1 week ago for end-stage renal disease. He has been dialyzed in [place] at an unknown facility.  He claims that he feels fine today. No dyspnea or chest pain. No fever, chills. Severity is mild. Exertion makes his symptoms worse. Additionally, his daughter has not been very happy with his living situation and has asked for a possible transfer out of [place]  Past Medical History  Diagnosis Date  . Arthritis   . Hypertension   . Stomach problems   . Gastroenteritis   . Rheumatoid arthritis 05/19/2013  . COPD (chronic obstructive pulmonary disease)   . CHF (congestive heart failure)   . Renal disorder    History reviewed. No pertinent past surgical history. No family history on file. History  Substance Use Topics  . Smoking status: Former Smoker    Types: Cigarettes    Quit date: 04/27/2013  . Smokeless tobacco: Former Systems developer    Quit date: 04/25/2013  . Alcohol Use: No     Comment: beer twice a week approx 1-2    Review of Systems  All other systems reviewed and are negative.     Allergies  Azithromycin and Other  Home Medications   Prior to Admission medications   Medication Sig Start Date End Date Taking? Authorizing Provider  albuterol (PROVENTIL HFA;VENTOLIN HFA) 108 (90 BASE) MCG/ACT inhaler Inhale 1-2 puffs into the lungs every 6 (six) hours as needed for wheezing or shortness of breath. 03/26/13  Yes Maudry Diego, MD  budesonide-formoterol (SYMBICORT) 80-4.5 MCG/ACT inhaler Inhale 2 puffs into the lungs 2 (two) times daily.   Yes Historical Provider, MD   calcium carbonate (TUMS - DOSED IN MG ELEMENTAL CALCIUM) 500 MG chewable tablet Chew 2 tablets by mouth 3 (three) times daily with meals.   Yes Historical Provider, MD  carboxymethylcellulose 1 % ophthalmic solution Place 1 drop into both eyes 4 (four) times daily.   Yes Historical Provider, MD  cholecalciferol (VITAMIN D) 1000 UNITS tablet Take 1,000 Units by mouth daily.   Yes Historical Provider, MD  ferrous sulfate 325 (65 FE) MG tablet Take 325 mg by mouth 3 (three) times daily with meals.   Yes Historical Provider, MD  folic acid (FOLVITE) 1 MG tablet Take 1 mg by mouth daily.   Yes Historical Provider, MD  furosemide (LASIX) 80 MG tablet Take 80 mg by mouth 4 (four) times a week. Take on non dialysis days Sunday,Tues,Thrus & sat   Yes Historical Provider, MD  gabapentin (NEURONTIN) 300 MG capsule Take 300 mg by mouth at bedtime.    Yes Historical Provider, MD  hydroxychloroquine (PLAQUENIL) 200 MG tablet Take 200 mg by mouth daily.   Yes Historical Provider, MD  metoprolol tartrate (LOPRESSOR) 25 MG tablet Take 25 mg by mouth 2 (two) times daily.   Yes Historical Provider, MD  nystatin (MYCOSTATIN) 100000 UNIT/ML suspension Take 5 mLs by mouth 4 (four) times daily. For 10 days 07/03/13  Yes Historical Provider, MD  Omega-3 Fatty Acids (FISH OIL) 1000 MG CAPS Take 2,000 mg by mouth daily.   Yes Historical Provider, MD  polyethylene glycol (MIRALAX / GLYCOLAX) packet  Take 17 g by mouth daily as needed for mild constipation.   Yes Historical Provider, MD  traMADol (ULTRAM) 50 MG tablet Take one tablet by mouth four times daily as needed for pain 07/06/13  Yes Tiffany L Reed, DO  amLODipine (NORVASC) 10 MG tablet Take 10 mg by mouth daily.    Historical Provider, MD  omeprazole (PRILOSEC) 20 MG capsule Take 20 mg by mouth daily.    Historical Provider, MD   BP 112/71  Pulse 90  Temp(Src) 97.4 F (36.3 C) (Oral)  Resp 15  SpO2 98% Physical Exam  Nursing note and vitals  reviewed. Constitutional: He is oriented to person, place, and time.  Minimal dyspnea  HENT:  Head: Normocephalic and atraumatic.  Eyes: Conjunctivae and EOM are normal. Pupils are equal, round, and reactive to light.  Neck: Normal range of motion. Neck supple.  Cardiovascular: Normal rate, regular rhythm and normal heart sounds.   Pulmonary/Chest: Effort normal and breath sounds normal.  Abdominal: Soft. Bowel sounds are normal.  Musculoskeletal: Normal range of motion.  Neurological: He is alert and oriented to person, place, and time.  Skin: Skin is warm and dry.  Psychiatric: He has a normal mood and affect. His behavior is normal.    ED Course  Procedures (including critical care time) Labs Review Labs Reviewed  BASIC METABOLIC PANEL - Abnormal; Notable for the following:    BUN 67 (*)    Creatinine, Ser 4.59 (*)    Calcium 7.9 (*)    GFR calc non Af Amer 12 (*)    GFR calc Af Amer 14 (*)    All other components within normal limits  CBC WITH DIFFERENTIAL - Abnormal; Notable for the following:    WBC 13.7 (*)    RBC 3.14 (*)    Hemoglobin 8.5 (*)    HCT 27.0 (*)    RDW 19.0 (*)    Platelets 103 (*)    Neutro Abs 10.1 (*)    All other components within normal limits  TROPONIN I    Imaging Review Dg Chest Port 1 View  07/06/2013   CLINICAL DATA:  Shortness of breath. With history of CHF and COPD and acute renal failure  EXAM: PORTABLE CHEST - 1 VIEW  COMPARISON:  Portable chest of May 23, 2013  FINDINGS: The lung volumes are decreased. The interstitial markings are increased bilaterally and are more conspicuous today on the right. Obscuration of the hemidiaphragms persists. The cardiac silhouette is mildly enlarged where visualized. The pulmonary vascularity is prominent. The dialysis catheter tip lies at the junction of the SVC with the right atrium.  IMPRESSION: There is pulmonary interstitial edema with bilateral pleural effusions likely indicating CHF. There has been  slight deterioration since the previous study.   Electronically Signed   By: David  Martinique   On: 07/06/2013 13:06     EKG Interpretation   Date/Time:  Monday July 06 2013 12:35:37 EDT Ventricular Rate:  86 PR Interval:  126 QRS Duration: 77 QT Interval:  343 QTC Calculation: 410 R Axis:   72 Text Interpretation:  Sinus rhythm Low voltage, extremity leads Probable  anteroseptal infarct, old Baseline wander in lead(s) V4 Confirmed by Hiran Leard   MD, Hatcher Froning ([id number]) on 07/06/2013 1:02:29 PM      MDM   Final diagnoses:  End stage renal disease    Patient needs dialysis. Discussed with Dr. Lorrene Reid nephrologist. Admit to internal medicine teaching service Dr. Lynnae January attending.  Patient is hemodynamically stable  Nat Christen, MD 07/06/13 [id number]

## 2013-07-06 NOTE — Consult Note (Signed)
Renal Service Consult Note [place] 07/06/2013 Roney Jaffe D Requesting Physician:  Dr Lynnae January  Reason for Consult: Patient recently started on HD at [facility] presenting from SNF with SOB and hypoxemia HPI: The patient is a 63 y.o. year-old with hx of HTN x 85yrs, RA, COPD, prior smoking hx and "CHF" who presented to the ED at [facility] today sent from [facility] for SOB.  In ED SaO2 was 85% on RA.  CXR showed bilat pulm edema and pleural effusions.    Patient was here from 4/27-05/23/13 with diagnosis of acute resp failure, AKI, COPD, diffuse lymphadenopathy concerning for lymphoma, low plts, severe malnutrition. Renal US and CT showed normal kidneys.  He had bilat pleural effusions and ascites, attempt at paracentesis was unsuccessful.  COPD flare rx'd w steroids, O2, nebs.  ECHO showed LV EF 65%, small pericardial effusion, R pleural effusion. He had nonimproving renal failure, persistent resp failure on bipap with IS process vs edema, and needed LN biopsy so was transferred to tertiary care center on 05/23/13.   Care Everywhere chart was reviewed.  Pt was there from 05/23/13 to 07/03/13.  There is a lot of lab and procedure information but I do not see any physician notes.  His dx was AKI, edema, low plts, adenopathy with LN biopsy (axilla), COPD, RA, HTN, pericardial effusion with pericardial window on 06/19/13. Also had what sounds like left side thoracentesis. He was started on dialysis but there are no renal notes in the chart.  The following labs were checked and were negative -- ANCA, anti-GBM, C3/C4, hep B surface Ag and Ab, Hep C, HIV.  Per patient and his daughter, they told them they are waiting to see if his kidneys "get better in the next 2-4 weeks or so".  He gets ouptatient HD in [place] on a MWF schedule.  He was d/c'd on 07/03/13 according to daughter.    Chart review: As above  ROS  no CP, prod cough, abd pain, n/v/d  +orthopnea, SOB , DOE  no jt  pain  no HA, no confusion  no rash  Past Medical History  Past Medical History  Diagnosis Date  . Arthritis   . Hypertension   . Stomach problems   . Gastroenteritis   . Rheumatoid arthritis 05/19/2013  . COPD (chronic obstructive pulmonary disease)   . CHF (congestive heart failure)   . Renal disorder    Past Surgical History History reviewed. No pertinent past surgical history. Family History No family history on file. Social History  reports that he quit smoking about 2 months ago. His smoking use included Cigarettes. He smoked 0.00 packs per day. He quit smokeless tobacco use about 2 months ago. He reports that he does not drink alcohol or use illicit drugs. Allergies  Allergies  Allergen Reactions  . Azithromycin Rash and Shortness Of Breath  . Other     "new antibiotic pill, caused a rash" unable to recall name of medication.    Home medications Prior to Admission medications   Medication Sig Start Date End Date Taking? Authorizing Provider  albuterol (PROVENTIL HFA;VENTOLIN HFA) 108 (90 BASE) MCG/ACT inhaler Inhale 1-2 puffs into the lungs every 6 (six) hours as needed for wheezing or shortness of breath. 03/26/13  Yes Maudry Diego, MD  budesonide-formoterol (SYMBICORT) 80-4.5 MCG/ACT inhaler Inhale 2 puffs into the lungs 2 (two) times daily.   Yes Historical Provider, MD  calcium carbonate (TUMS - DOSED IN MG ELEMENTAL CALCIUM) 500  MG chewable tablet Chew 2 tablets by mouth 3 (three) times daily with meals.   Yes Historical Provider, MD  carboxymethylcellulose 1 % ophthalmic solution Place 1 drop into both eyes 4 (four) times daily.   Yes Historical Provider, MD  cholecalciferol (VITAMIN D) 1000 UNITS tablet Take 1,000 Units by mouth daily.   Yes Historical Provider, MD  ferrous sulfate 325 (65 FE) MG tablet Take 325 mg by mouth 3 (three) times daily with meals.   Yes Historical Provider, MD  folic acid (FOLVITE) 1 MG tablet Take 1 mg by mouth daily.   Yes Historical  Provider, MD  furosemide (LASIX) 80 MG tablet Take 80 mg by mouth 4 (four) times a week. Take on non dialysis days Sunday,Tues,Thrus & sat   Yes Historical Provider, MD  gabapentin (NEURONTIN) 300 MG capsule Take 300 mg by mouth at bedtime.    Yes Historical Provider, MD  hydroxychloroquine (PLAQUENIL) 200 MG tablet Take 200 mg by mouth daily.   Yes Historical Provider, MD  metoprolol tartrate (LOPRESSOR) 25 MG tablet Take 25 mg by mouth 2 (two) times daily.   Yes Historical Provider, MD  nystatin (MYCOSTATIN) 100000 UNIT/ML suspension Take 5 mLs by mouth 4 (four) times daily. For 10 days 07/03/13  Yes Historical Provider, MD  Omega-3 Fatty Acids (FISH OIL) 1000 MG CAPS Take 2,000 mg by mouth daily.   Yes Historical Provider, MD  polyethylene glycol (MIRALAX / GLYCOLAX) packet Take 17 g by mouth daily as needed for mild constipation.   Yes Historical Provider, MD  traMADol (ULTRAM) 50 MG tablet Take one tablet by mouth four times daily as needed for pain 07/06/13  Yes Tiffany L Reed, DO  amLODipine (NORVASC) 10 MG tablet Take 10 mg by mouth daily.    Historical Provider, MD  omeprazole (PRILOSEC) 20 MG capsule Take 20 mg by mouth daily.    Historical Provider, MD   Liver Function Tests No results found for this basename: AST, ALT, ALKPHOS, BILITOT, PROT, ALBUMIN,  in the last 168 hours No results found for this basename: LIPASE, AMYLASE,  in the last 168 hours CBC  Recent Labs Lab 07/06/13 1354  WBC 13.7*  NEUTROABS 10.1*  HGB 8.5*  HCT 27.0*  MCV 86.0  PLT 191*   Basic Metabolic Panel  Recent Labs Lab 07/06/13 1354  NA 138  K 4.7  CL 99  CO2 21  GLUCOSE 76  BUN 67*  CREATININE 4.59*  CALCIUM 7.9*    Filed Vitals:   07/06/13 1515 07/06/13 1600 07/06/13 1630 07/06/13 1641  BP: 133/68 120/69 127/77 122/71  Pulse: 95 30 97 97  Temp:      TempSrc:      Resp: 18 16 18 15   SpO2: 86% 77% 96% 98%   Exam: Small framed, ill-appearing AAM , looks uncomfortable but not in  distress No rash, cyanosis or gangrene Sclera anicteric, throat clear ++ JVD Chest bilat rales 1/3 up post RRR faint SEM no RG Abd distended with ascites, healing scar subxiphoid, small wound with scant drainage at distal aspect of wound 2-3 + pitting bilat LE edema diffusely No ulcer, gangrene or jt effusion Neuro is nf, Ox 3, mod gen weakness R IJ HD cath  CXR 6/15 bilat pulm edema w effusions 24 hr urine 1.2-2.0 gm proteinuria at [facility]  HD: MWF in [place]  Assessment: 1 Hypoxemia / massive vol overload / pulm edema w pleural effusions 2 Renal failure, recent onset- HD dependent x about 2 weeks per  family; w/u at [facility] with negative ANCA, anti-GBM, C3/C4, hep B and hep C so far.    3 HTN 3 yrs duration 4 Pericardial effusion, s/p pericardial window 5/29 5 Lymphadenopathy- s/p LN biopsy 06/30/13 6 Malnutrition   Plan- acute HD tonight for hypoxemia due to vol overload; if pt needs to be transferred back to [place] I have no problem with that but we should get one HD in tonight to get volume off and lessen the chance for resp decompensation.  If he stays here he will need multiple HD sessions to address the vol overload.     Kelly Splinter MD (pgr) [phone]    ([phone] 07/06/2013, 5:47 PM

## 2013-07-06 NOTE — ED Notes (Signed)
Per EMS: Pt arrives from [facility] for eval of low oxygen levels this morning. Facility reports that during vital signs pt oxygen level was 85% on RA. EMS arrived and noted that pt was 85% on 6L. Pt reports no sob, cp, or any complaints. Pt states while seen at [facility] his oxygen level ran low, currently reports that he is on no oxygen. Oxygen levels noted to be 97% on 2L upon assessment currently. nad noted, axo x4. Pt MWF dialysis pt with last full treatment 6/12

## 2013-07-06 NOTE — Procedures (Signed)
I was present at this dialysis session, have reviewed the session itself and made  appropriate changes  Kelly Splinter MD (pgr) [phone]    ([phone] 07/06/2013, 11:43 PM

## 2013-07-06 NOTE — Progress Notes (Signed)
HISTORY & PHYSICAL  DATE: 07/06/2013   FACILITY: [facility]  LEVEL OF CARE: SNF (31)  ALLERGIES:  Allergies  Allergen Reactions  . Azithromycin Rash and Shortness Of Breath  . Other     "new antibiotic pill, caused a rash" unable to recall name of medication.     CHIEF COMPLAINT:  Manage pericardial effusion, COPD and hypertension  HISTORY OF PRESENT ILLNESS: 63 year old African American male was hospitalized secondary to acute respiratory distress. After hospitalization is admitted to this facility for short-term rehabilitation.  PERICARDIAL EFFUSION: Patient was diagnosed with pericardia effusion at admission and was thought to be in pericardial tamponade. He underwent pericardial window with drain placement by CT surgery. TTE after surgery showed improved pericardial effusion. Pericardial fluid was negative for malignancy. Patient denies chest pain or shortness of breath. Pericardial fluid Gram stain and bacterial and fungal culture were negative. Biopsy was negative for malignancy. Fluid for flow cytometric was normal. TSH, C3, C4 and hepatitis panel were unremarkable. Uric acid and LDH were unremarkable.  COPD: the COPD remains stable.  Pt denies sob, cough, wheezing or declining exercise tolerance.  No complications from the medications presently being used.  HTN: Pt 's HTN remains stable.  Denies CP, sob, DOE, headaches, dizziness or visual disturbances.  No complications from the medications currently being used. Complains of lower extremity swelling.  Last BP : 98/58.  PAST MEDICAL HISTORY :  Past Medical History  Diagnosis Date  . Arthritis   . Hypertension   . Stomach problems   . Gastroenteritis   . Rheumatoid arthritis 05/19/2013  . COPD (chronic obstructive pulmonary disease)   . CHF (congestive heart failure)   . Renal disorder     PAST SURGICAL HISTORY: Soft tissue excision from the back  SOCIAL HISTORY:  reports that he quit  smoking about 2 months ago. His smoking use included Cigarettes. He smoked 0.00 packs per day. He quit smokeless tobacco use about 2 months ago. He reports that he does not drink alcohol or use illicit drugs.  FAMILY HISTORY: CAD  CURRENT MEDICATIONS: Reviewed per MAR/see medication list  REVIEW OF SYSTEMS:  See HPI otherwise 14 point ROS is negative.  PHYSICAL EXAMINATION  VS:  See VS section  GENERAL: no acute distress, normal body habitus EYES: conjunctivae normal, sclerae normal, normal eye lids MOUTH/THROAT: lips without lesions,no lesions in the mouth,tongue is without lesions,uvula elevates in midline NECK: supple, trachea midline, no neck masses, no thyroid tenderness, no thyromegaly LYMPHATICS: no LAN in the neck, no supraclavicular LAN RESPIRATORY: breathing is even & unlabored, BS CTAB CARDIAC: RRR, no murmur,no extra heart sounds, +3 bilateral lower extremity pitting edema GI:  ABDOMEN: abdomen firm and distended, bowel sounds diminished, no masses, no tenderness  LIVER/SPLEEN: no hepatomegaly, no splenomegaly MUSCULOSKELETAL: HEAD: normal to inspection  EXTREMITIES: LEFT UPPER EXTREMITY: full range of motion, normal strength & tone RIGHT UPPER EXTREMITY:  full range of motion, normal strength & tone LEFT LOWER EXTREMITY:  Moderate range of motion, normal strength & tone RIGHT LOWER EXTREMITY:  Moderate range of motion, normal strength & tone PSYCHIATRIC: the patient is alert & oriented to person, affect & behavior appropriate  LABS/RADIOLOGY:  Labs reviewed: Basic Metabolic Panel:  Recent Labs  05/20/13 0531 05/21/13 0528  05/22/13 0346 05/23/13 0501 07/06/13 1354  NA 133* 134*  < > 135* 131* 138  K 5.0 5.6*  < > 4.8 5.1 4.7  CL 100 103  < >  103 99 99  CO2 19 18*  < > 18* 20 21  GLUCOSE 130* 120*  < > 145* 133* 76  BUN 48* 47*  < > 61* 68* 67*  CREATININE 2.56* 2.30*  < > 2.40* 2.43* 4.59*  CALCIUM 7.7* 7.9*  < > 8.2* 8.3* 7.9*  PHOS  --  4.5  --  4.6   --   --   < > = values in this interval not displayed. Liver Function Tests:  Recent Labs  05/19/13 0506 05/21/13 1447 05/22/13 0346  AST 12 19 15   ALT 5 6 5   ALKPHOS 148* 172* 131*  BILITOT 0.5 0.5 0.5  PROT 6.5 7.0 6.4  ALBUMIN 1.8* 2.0* 1.8*    Recent Labs  11/18/12 1551 05/21/13 1447  LIPASE 19 19   CBC:  Recent Labs  05/18/13 1334  05/19/13 0953  05/22/13 0858 05/23/13 0501 07/06/13 1354  WBC 9.1  < >  --   < > 9.7 16.8* 13.7*  NEUTROABS 6.3  --  4.4  --   --   --  10.1*  HGB 9.6*  < >  --   < > 7.9* 10.8* 8.5*  HCT 29.6*  < >  --   < > 23.3* 30.7* 27.0*  MCV 80.7  < >  --   < > 79.3 79.9 86.0  PLT 72*  < >  --   < > 41* 35* 103*  < > = values in this interval not displayed.  Cardiac Enzymes:  Recent Labs  05/21/13 0528  05/21/13 2110 05/22/13 0346 07/06/13 1354  CKTOTAL 55  --   --   --   --   TROPONINI  --   < > <0.30 <0.30 <0.30  < > = values in this interval not displayed.   Transthoracic Echocardiography  Patient:    Gabriel Davenport, Teschner MR #:       [id number] Study Date: 05/23/2013 Gender:     M Age:        41 Height:     160cm Weight:     56.7kg BSA:        1.11m^2 Pt. Status: Room:    ATTENDING    Hilliard Clark, Nimish C  SONOGRAPHER  Cindy Hazy, RDCS  ORDERING     Murray Hodgkins  PERFORMING   Chmg, Forestine Na cc:  ------------------------------------------------------------ LV EF: 60% -   65%  ------------------------------------------------------------ Indications:      Shortness of breath 786.05.  ------------------------------------------------------------ History:   PMH:  Acquired from the patient and from the patient's chart.  PMH:  Pleural Effusion. Fever. Ascites. COPD. Pneumonia.  Risk factors:  Current tobacco use. Hypertension.  ------------------------------------------------------------ Study Conclusions  - Left ventricle: The cavity size was normal. There was mild    concentric hypertrophy. Systolic function was normal. The   estimated ejection fraction was in the range of 60% to   65%. Wall motion was normal; there were no regional wall   motion abnormalities. There was an increased relative   contribution of atrial contraction to ventricular filling.   Doppler parameters are consistent with abnormal left   ventricular relaxation (grade 1 diastolic dysfunction). - Aortic valve: Mild thickening and calcification,   consistent with sclerosis. - Mitral valve: Mild regurgitation. - Pulmonary arteries: PA peak pressure: 9mm Hg (S). - Pericardium, extracardiac: A trivial, free-flowing   pericardial effusion was identified circumferential to the   heart. There was no evidence of  hemodynamic compromise.   There was a right pleural effusion. There was a left   pleural effusion. Impressions:  - The right ventricular systolic pressure was increased   consistent with mild pulmonary hypertension. Transthoracic echocardiography.  M-mode, complete 2D, spectral Doppler, and color Doppler.  Height:  Height: 160cm. Height: 63in.  Weight:  Weight: 56.7kg. Weight: 124.7lb.  Body mass index:  BMI: 22.1kg/m^2.  Body surface area:    BSA: 1.59m^2.  Blood pressure:     128/76.  Patient status:  Inpatient.  Location:  ICU/CCU  ------------------------------------------------------------  ------------------------------------------------------------ Left ventricle:  The cavity size was normal. There was mild concentric hypertrophy. Systolic function was normal. The estimated ejection fraction was in the range of 60% to 65%. Wall motion was normal; there were no regional wall motion abnormalities. There was an increased relative contribution of atrial contraction to ventricular filling. Doppler parameters are consistent with abnormal left ventricular relaxation (grade 1 diastolic dysfunction).  ------------------------------------------------------------ Aortic  valve:   Trileaflet.  Mild thickening and calcification, consistent with sclerosis. Mobility was not restricted.  Doppler:  Transvalvular velocity was within the normal range. There was no stenosis.  No regurgitation.  ------------------------------------------------------------ Aorta:  Aortic root: The aortic root was normal in size.  ------------------------------------------------------------ Mitral valve:   Structurally normal valve.   Mobility was not restricted.  Doppler:  Transvalvular velocity was within the normal range. There was no evidence for stenosis.  Mild regurgitation.    Peak gradient: 45mm Hg (D).  ------------------------------------------------------------ Left atrium:  The atrium was normal in size.  ------------------------------------------------------------ Right ventricle:  The cavity size was normal. Wall thickness was normal. Systolic function was normal.  ------------------------------------------------------------ Pulmonic valve:    Structurally normal valve.   Cusp separation was normal.  Doppler:  Transvalvular velocity was within the normal range. There was no evidence for stenosis.  No regurgitation.  ------------------------------------------------------------ Tricuspid valve:   Structurally normal valve.    Doppler: Transvalvular velocity was within the normal range.  Mild regurgitation.  ------------------------------------------------------------ Pulmonary artery:   The main pulmonary artery was normal-sized. Systolic pressure was within the normal range.   ------------------------------------------------------------ Right atrium:  The atrium was normal in size.  ------------------------------------------------------------ Pericardium:  A trivial, free-flowing pericardial effusion was identified circumferential to the heart. There was no evidence of hemodynamic  compromise.  ------------------------------------------------------------ Systemic veins: Inferior vena cava: The vessel was normal in size.  ------------------------------------------------------------ Pleura:  There was a right pleural effusion. There was a left pleural effusion.  ------------------------------------------------------------  2D measurements        Normal  Doppler measurements   Normal Left ventricle                 Main pulmonary LVID ED,   37.6 mm     43-52   artery chord,                         Pressure,     43 mm Hg =30 PLAX                           S LVID ES,   23.7 mm     23-38   Left ventricle chord,                         Ea, lat     8.27 cm/s  ------ PLAX  ann, tiss FS, chord,   37 %      >29     DP PLAX                           E/Ea, lat  10.08       ------ LVPW, ED     12 mm     ------  ann, tiss IVS/LVPW      1        <1.3    DP ratio, ED                      Ea, med     7.07 cm/s  ------ Ventricular septum             ann, tiss IVS, ED      12 mm     ------  DP LVOT                           E/Ea, med   11.8       ------ Diam, S      19 mm     ------  ann, tiss Area       2.84 cm^2   ------  DP Diam         19 mm     ------  LVOT Aorta                          Peak vel,    127 cm/s  ------ Root diam,   26 mm     ------  S ED                             VTI, S      23.4 cm    ------ Left atrium                    Peak           6 mm Hg ------ AP dim       33 mm     ------  gradient, AP dim     2.09 cm/m^2 <2.2    S index                          Stroke vol  66.3 ml    ------                                Stroke        42 ml/m^ ------                                index            2                                Mitral valve                                Peak E vel  83.4 cm/s  ------  Peak A vel   105 cm/s  ------                                Decelerati   204 ms    150-23                                 on time                0                                Peak           3 mm Hg ------                                gradient,                                D                                Peak E/A     0.8       ------                                ratio                                Tricuspid valve                                Regurg       318 cm/s  ------                                peak vel                                Peak RV-RA    40 mm Hg ------                                gradient,                                S                                Max regurg   318 cm/s  ------                                vel  Systemic veins                                Estimated      3 mm Hg ------                                CVP                                Right ventricle                                Pressure,     43 mm Hg <30                                S                                Sa vel,       17 cm/s  ------                                lat ann,                                tiss DP LIMITED ABDOMEN ULTRASOUND FOR ASCITES   TECHNIQUE: Limited ultrasound survey for ascites was performed in all four abdominal quadrants.   COMPARISON:  CT abdomen and pelvis 05/18/2013   FINDINGS: Survey imaging of the 4 quadrants was performed to assess for ascites.   Only minimal ascites identified in the lower quadrants bilaterally and adjacent the liver.   Volume of ascites sonographically visualized is insufficient for paracentesis.   IMPRESSION: Minimal ascites, insufficient for paracentesis.   PORTABLE CHEST - 1 VIEW   COMPARISON:  Portable chest of May 23, 2013   FINDINGS: The lung volumes are decreased. The interstitial markings are increased bilaterally and are more conspicuous today on the right. Obscuration of the hemidiaphragms persists. The cardiac silhouette is mildly enlarged where  visualized. The pulmonary vascularity is prominent. The dialysis catheter tip lies at the junction of the SVC with the right atrium.   IMPRESSION: There is pulmonary interstitial edema with bilateral pleural effusions likely indicating CHF. There has been slight deterioration since the previous study.   ASSESSMENT/PLAN:  Pericardial effusion-status post pericardial window. No acute symptoms. COPD-compensated Renovascular Hypertension-stable Rheumatoid arthritis-on methotrexate End-stage renal disease-on hemodialysis  Lymphadenopathy-status post excisional biopsy. Results pending. Hematology following as outpatient. Check CBC and BMP  I have reviewed patient's medical records received at admission/from hospitalization.  CPT CODE: 47096  Gayani Y Dasanayaka, [place] [phone]

## 2013-07-06 NOTE — Telephone Encounter (Signed)
[facility] 

## 2013-07-07 ENCOUNTER — Inpatient Hospital Stay (HOSPITAL_COMMUNITY): Payer: Medicare Other

## 2013-07-07 DIAGNOSIS — R197 Diarrhea, unspecified: Secondary | ICD-10-CM

## 2013-07-07 DIAGNOSIS — D649 Anemia, unspecified: Secondary | ICD-10-CM

## 2013-07-07 DIAGNOSIS — D696 Thrombocytopenia, unspecified: Secondary | ICD-10-CM

## 2013-07-07 LAB — CBC
HCT: 21.1 % — ABNORMAL LOW (ref 39.0–52.0)
HCT: 26 % — ABNORMAL LOW (ref 39.0–52.0)
Hemoglobin: 6.9 g/dL — CL (ref 13.0–17.0)
Hemoglobin: 8.7 g/dL — ABNORMAL LOW (ref 13.0–17.0)
MCH: 27.8 pg (ref 26.0–34.0)
MCH: 29 pg (ref 26.0–34.0)
MCHC: 32.7 g/dL (ref 30.0–36.0)
MCHC: 33.5 g/dL (ref 30.0–36.0)
MCV: 85.1 fL (ref 78.0–100.0)
MCV: 86.7 fL (ref 78.0–100.0)
PLATELETS: 43 10*3/uL — AB (ref 150–400)
Platelets: 50 10*3/uL — ABNORMAL LOW (ref 150–400)
RBC: 2.48 MIL/uL — ABNORMAL LOW (ref 4.22–5.81)
RBC: 3 MIL/uL — ABNORMAL LOW (ref 4.22–5.81)
RDW: 18.9 % — ABNORMAL HIGH (ref 11.5–15.5)
RDW: 17.4 % — AB (ref 11.5–15.5)
WBC: 15.6 10*3/uL — ABNORMAL HIGH (ref 4.0–10.5)
WBC: 15.5 10*3/uL — AB (ref 4.0–10.5)

## 2013-07-07 LAB — BASIC METABOLIC PANEL
BUN: 33 mg/dL — ABNORMAL HIGH (ref 6–23)
CHLORIDE: 101 meq/L (ref 96–112)
CO2: 25 mEq/L (ref 19–32)
Calcium: 7.6 mg/dL — ABNORMAL LOW (ref 8.4–10.5)
Creatinine, Ser: 2.95 mg/dL — ABNORMAL HIGH (ref 0.50–1.35)
GFR, EST AFRICAN AMERICAN: 25 mL/min — AB (ref >90)
GFR, EST NON AFRICAN AMERICAN: 21 mL/min — AB (ref >90)
Glucose, Bld: 87 mg/dL (ref 70–99)
POTASSIUM: 4.3 meq/L (ref 3.7–5.3)
SODIUM: 140 meq/L (ref 137–147)

## 2013-07-07 LAB — ABO/RH: ABO/RH(D): A POS

## 2013-07-07 LAB — PREPARE RBC (CROSSMATCH)

## 2013-07-07 MED ORDER — HEPARIN SODIUM (PORCINE) 1000 UNIT/ML DIALYSIS
2000.0000 [IU] | INTRAMUSCULAR | Status: DC | PRN
  Filled 2013-07-07: qty 2

## 2013-07-07 MED ORDER — NEPRO/CARBSTEADY PO LIQD
237.0000 mL | ORAL | Status: DC | PRN
Start: 2013-07-07 — End: 2013-07-07

## 2013-07-07 MED ORDER — LIDOCAINE-PRILOCAINE 2.5-2.5 % EX CREA
1.0000 | TOPICAL_CREAM | CUTANEOUS | Status: DC | PRN
Start: 2013-07-07 — End: 2013-07-07

## 2013-07-07 MED ORDER — HEPARIN SODIUM (PORCINE) 1000 UNIT/ML DIALYSIS
1000.0000 [IU] | INTRAMUSCULAR | Status: DC | PRN
  Filled 2013-07-07: qty 1

## 2013-07-07 MED ORDER — SODIUM CHLORIDE 0.9 % IV SOLN: 100.0000 mL | INTRAVENOUS | Status: DC | PRN

## 2013-07-07 MED ORDER — NEPRO/CARBSTEADY PO LIQD: 237.0000 mL | ORAL | Status: DC | PRN

## 2013-07-07 MED ORDER — PENTAFLUOROPROP-TETRAFLUOROETH EX AERO: 1.0000 "application " | INHALATION_SPRAY | CUTANEOUS | Status: DC | PRN

## 2013-07-07 MED ORDER — LIDOCAINE HCL (PF) 1 % IJ SOLN: 5.0000 mL | INTRAMUSCULAR | Status: DC | PRN

## 2013-07-07 MED ORDER — ALTEPLASE 2 MG IJ SOLR
2.0000 mg | Freq: Once | INTRAMUSCULAR | Status: DC | PRN
  Filled 2013-07-07: qty 2

## 2013-07-07 MED ORDER — LIDOCAINE-PRILOCAINE 2.5-2.5 % EX CREA: 1.0000 "application " | TOPICAL_CREAM | CUTANEOUS | Status: DC | PRN

## 2013-07-07 NOTE — Progress Notes (Signed)
Subjective: Patient reports feeling better today,  He denies any acute complaints.  He denies any SOB and is currently on 2L via [place]. Patient with formed stool diarrhea in bedside commode. Objective: Vital signs in last 24 hours: Filed Vitals:   07/07/13 0349 07/07/13 0500 07/07/13 0532 07/07/13 0923  BP:   100/59 110/64  Pulse:   106 100  Temp:   99.1 F (37.3 C) 98.4 F (36.9 C)  TempSrc:   Oral Oral  Resp:   18 18  Height:      Weight:  53 lb 3.2 oz (24.131 kg)    SpO2: 94%  99% 99%   Weight change:   Intake/Output Summary (Last 24 hours) at 07/07/13 1040 Last data filed at 07/07/13 0900  Gross per 24 hour  Intake    480 ml  Output   2328 ml  Net  -1848 ml   General: resting in bed HEENT: PERRL Cardiac: RRR, no rubs, murmurs or gallops Pulm: Rales to middle lung fields, no wheezing Abd: soft, nontender, mildly distended, BS present Ext: warm and well perfused, 2-3+ pedal edema Neuro: alert and oriented X3, cranial nerves II-XII grossly intact  Lab Results: Basic Metabolic Panel:  Recent Labs Lab 07/06/13 1844 07/07/13 0457  NA 139 140  K 4.5 4.3  CL 100 101  CO2 24 25  GLUCOSE 99 87  BUN 67* 33*  CREATININE 4.47* 2.95*  CALCIUM 7.6* 7.6*  PHOS 6.1*  --    Liver Function Tests:  Recent Labs Lab 07/06/13 1844  ALBUMIN 1.9*   No results found for this basename: LIPASE, AMYLASE,  in the last 168 hours No results found for this basename: AMMONIA,  in the last 168 hours CBC:  Recent Labs Lab 07/06/13 1354 07/06/13 1844 07/07/13 0457  WBC 13.7* 15.7* 15.6*  NEUTROABS 10.1*  --   --   HGB 8.5* 7.2* 6.9*  HCT 27.0* 22.2* 21.1*  MCV 86.0 85.4 85.1  PLT 103* 98* 50*   Cardiac Enzymes:  Recent Labs Lab 07/06/13 1354  TROPONINI <0.30   Micro Results: Recent Results (from the past 240 hour(s))  MRSA PCR SCREENING     Status: None   Collection Time    07/06/13  6:59 PM      Result Value Ref Range Status   MRSA by PCR NEGATIVE  NEGATIVE  Final   Comment:            The GeneXpert MRSA Assay (FDA     approved for NASAL specimens     only), is one component of a     comprehensive MRSA colonization     surveillance program. It is not     intended to diagnose MRSA     infection nor to guide or     monitor treatment for     MRSA infections.   Studies/Results: Dg Chest Port 1 View  07/07/2013   CLINICAL DATA:  Pulmonary edema.  EXAM: PORTABLE CHEST - 1 VIEW  COMPARISON:  07/06/2013  FINDINGS: Right dialysis catheter remains in place, unchanged. Bilateral airspace opacities and layering effusions noted. Heart is borderline in size. No acute bony abnormality. No change since prior study.  IMPRESSION: Stable edema/ CHF and layering bilateral effusions.   Electronically Signed   By: Rolm Baptise M.D.   On: 07/07/2013 08:10   Dg Chest Port 1 View  07/06/2013   CLINICAL DATA:  Shortness of breath. With history of CHF and COPD and acute renal failure  EXAM: PORTABLE CHEST - 1 VIEW  COMPARISON:  Portable chest of May 23, 2013  FINDINGS: The lung volumes are decreased. The interstitial markings are increased bilaterally and are more conspicuous today on the right. Obscuration of the hemidiaphragms persists. The cardiac silhouette is mildly enlarged where visualized. The pulmonary vascularity is prominent. The dialysis catheter tip lies at the junction of the SVC with the right atrium.  IMPRESSION: There is pulmonary interstitial edema with bilateral pleural effusions likely indicating CHF. There has been slight deterioration since the previous study.   Electronically Signed   By: David  Martinique   On: 07/06/2013 13:06   Medications: I have reviewed the patient's current medications. Scheduled Meds: . amLODipine  10 mg Oral Daily  . budesonide-formoterol  2 puff Inhalation BID  . calcium carbonate  2 tablet Oral TID WC  . cholecalciferol  1,000 Units Oral Daily  . ferrous sulfate  325 mg Oral TID WC  . folic acid  1 mg Oral Daily  .  furosemide  80 mg Oral Once per day on Sun Tue Thu Sat  . gabapentin  300 mg Oral QHS  . hydroxychloroquine  200 mg Oral Daily  . metoprolol tartrate  25 mg Oral BID  . nystatin  5 mL Oral QID  . omega-3 acid ethyl esters  1 g Oral Daily  . pantoprazole  40 mg Oral Daily  . sodium chloride  3 mL Intravenous Q12H  . sodium chloride  3 mL Intravenous Q12H   Continuous Infusions:  PRN Meds:.sodium chloride, albuterol, polyvinyl alcohol, sodium chloride, traMADol Assessment/Plan: Acute Respiratory Failure  Dialysis removed 7 lbs.  Patient in no distress but still volume overloaded. Current O2 Sat 99% on 2L [place] - Admit to Telemetry  - Nephrology consulted>>> plan for HD tonight  - Supplemental O2 to maintain Ox sat >92%  - Duoneb Q4PRN   ARF requiring HD  - Nephrology following  - Patient dialyzed overnight removed 7lbs. - Plan for multiple HD sessions to remove weight here versus transfer to [place] and spoke with Dr. Shon Millet (Nephrology at [facility]) willing to accept patient, spoke with Dr. Jonnie Finner who agrees.  Patient will be transferred to [facility] for their nephrology team to resume care.  Thrombocytopenia - Plts 100k on admission, patient did receive Heparin [place] on admission.   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  HTN:  - Continue metoprolol   Rheumatoid Arthritis  - Methotrexate discontinued by [facility] as patient is on HD  Diarrhea: Only 2 episodes, not grossly watery, do not suspect C diff at this time.  Will hold off further treatment.  DVT PPx: SCDs (Pending HIT panel) Diet: Renal  Code Status: Full Dispo: Transfer to [facility]  The patient does have a current PCP (Provider Not In System) and does not need an [facility] hospital follow-up appointment after discharge.  The patient does not have transportation limitations that  hinder transportation to clinic appointments.  .Services Needed at time of discharge: Y = Yes, Blank = No PT:   OT:   RN:   Equipment:   Other:     LOS: 1 day   Joni Reining, DO 07/07/2013, 10:40 AM

## 2013-07-07 NOTE — Discharge Summary (Signed)
Name: Gabriel Davenport MRN: [id number] DOB: [birth year] 63 y.o. PCP: Provider Not In System  Date of Admission: 07/06/2013 11:37 AM Date of Discharge: 07/07/2013 Attending Physician: Gabriel Fireman, MD  Discharge Diagnosis: Principal Problem:   Acute respiratory failure with hypoxia Active Problems:   Lymphadenopathy   ARF (acute renal failure)   HTN (hypertension)   Rheumatoid arthritis   Pulmonary edema, acute   Thrombocytopenia, unspecified  Discharge Medications:   Medication List    STOP taking these medications       polyethylene glycol packet  Commonly known as:  MIRALAX / GLYCOLAX      TAKE these medications       albuterol 108 (90 BASE) MCG/ACT inhaler  Commonly known as:  PROVENTIL HFA;VENTOLIN HFA  Inhale 1-2 puffs into the lungs every 6 (six) hours as needed for wheezing or shortness of breath.     amLODipine 10 MG tablet  Commonly known as:  NORVASC  Take 10 mg by mouth daily.     budesonide-formoterol 80-4.5 MCG/ACT inhaler  Commonly known as:  SYMBICORT  Inhale 2 puffs into the lungs 2 (two) times daily.     calcium carbonate 500 MG chewable tablet  Commonly known as:  TUMS - dosed in mg elemental calcium  Chew 2 tablets by mouth 3 (three) times daily with meals.     carboxymethylcellulose 1 % ophthalmic solution  Place 1 drop into both eyes 4 (four) times daily.     cholecalciferol 1000 UNITS tablet  Commonly known as:  VITAMIN D  Take 1,000 Units by mouth daily.     ferrous sulfate 325 (65 FE) MG tablet  Take 325 mg by mouth 3 (three) times daily with meals.     Fish Oil 1000 MG Caps  Take 2,000 mg by mouth daily.     folic acid 1 MG tablet  Commonly known as:  FOLVITE  Take 1 mg by mouth daily.     furosemide 80 MG tablet  Commonly known as:  LASIX  Take 80 mg by mouth 4 (four) times a week. Take on non dialysis days Sunday,Tues,Thrus & sat     gabapentin 300 MG capsule  Commonly known as:  NEURONTIN  Take 300 mg by mouth at  bedtime.     hydroxychloroquine 200 MG tablet  Commonly known as:  PLAQUENIL  Take 200 mg by mouth daily.     metoprolol tartrate 25 MG tablet  Commonly known as:  LOPRESSOR  Take 25 mg by mouth 2 (two) times daily.     nystatin 100000 UNIT/ML suspension  Commonly known as:  MYCOSTATIN  Take 5 mLs by mouth 4 (four) times daily. For 10 days     omeprazole 20 MG capsule  Commonly known as:  PRILOSEC  Take 20 mg by mouth daily.     traMADol 50 MG tablet  Commonly known as:  ULTRAM  Take one tablet by mouth four times daily as needed for pain        Disposition and follow-up:   Mr.Gabriel Davenport was discharged from [facility] [place] condition.  Patient to be transferred to [facility] under care of Dr. Shon Davenport (Nephrology)  1.  Volume Overload/ Pulmonary Edema/ Acute Renal Failure on HD.  Patient dialyzed 7lbs off on 6/15. Still grossly volume overloaded, will need further dialysis.  He was dialyzed again on 6/16 with 6 lbs removed.Marland Kitchen  He will need further dialysis at [facility].  2. Anemia: Hgb on  presentation 8.5 g/dL, on repeat a few hours later this had dropped to 7.2g/dL and further drop to 6.9g/dL on hospital day 1.  Patient asymptomatic, with no signs of bleeding, he was given 1 unit of PRBC at repeat dialysis on 6/16.  Post transfusion Hgb 8.7 g/dL  3. Thrombocytopenia: Patient platelet count 100k on admission, dropped overnight to 50k, patient did receive heparin for DVT PPx.  This was discontinued and SCDs were started.  HIT panel obtained at [facility].  4.  Lymphadenopathy: Current workup at [facility].  Discharge Instructions   Bed rest    Complete by:  As directed      Discharge diet:    Complete by:  As directed           Consultations: Treatment Team:  Gabriel Blazing, MD  Procedures Performed:  Dg Chest Port 1 View  07/07/2013   CLINICAL DATA:  Pulmonary edema.  EXAM: PORTABLE CHEST - 1 VIEW  COMPARISON:  07/06/2013  FINDINGS: Right  dialysis catheter remains in place, unchanged. Bilateral airspace opacities and layering effusions noted. Heart is borderline in size. No acute bony abnormality. No change since prior study.  IMPRESSION: Stable edema/ CHF and layering bilateral effusions.   Electronically Signed   By: Gabriel Davenport M.D.   On: 07/07/2013 08:10   Dg Chest Port 1 View  07/06/2013   CLINICAL DATA:  Shortness of breath. With history of CHF and COPD and acute renal failure  EXAM: PORTABLE CHEST - 1 VIEW  COMPARISON:  Portable chest of May 23, 2013  FINDINGS: The lung volumes are decreased. The interstitial markings are increased bilaterally and are more conspicuous today on the right. Obscuration of the hemidiaphragms persists. The cardiac silhouette is mildly enlarged where visualized. The pulmonary vascularity is prominent. The dialysis catheter tip lies at the junction of the SVC with the right atrium.  IMPRESSION: There is pulmonary interstitial edema with bilateral pleural effusions likely indicating CHF. There has been slight deterioration since the previous study.   Electronically Signed   By: Gabriel  Davenport   On: 07/06/2013 13:06   Admission HPI: Gabriel Davenport is a 63 yo Male with extensive PMH. Most recently he has been worked up at [facility] for lymphadenopathy, ARF (?Vanc vs parainfluenza, vs RA), PNA, Pleural Effusions (s/p left thoracentesis with 800cc removed), Pericardial effusion (s/p pericardiac window and drain>>> removed 6/8) . He was discharged to [facility] from [facility] on 6/12. 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 He has yet to follow up with [facility] Hematology or Nephrology about this result. Per ED notes patient was sent from [facility] this AM due to hypoxia at this SNF (85% on RA). Then per EMS Oxygen sat was 85% on 6L [place], and noted to be 97% on 2L on arrival to the ED.  Currently his daughter is at his  bedside. He reports that that he has progressively been getting SOB since his [facility] on Friday from [facility]. He does note an associated cough but reports that was present at [facility]. Otherwise he denies any fever, nausea, vomiting, constipation, abdominal pain, chest pain, dizziness, fatigue. He does admit one episode of watery diarrhea in our ED. Otherwise he has no complaints.  Daughter reports that [facility] did not given Mr. Herbers any medications on Friday, gave him his medications on Saturday after she asked about it. She does not want him returning to  [place].   Hospital Course by problem list: Acute Respiratory Failure  Patient presented with Acute Respiratory Failure, he does have a history of CHF and COPD however has recently had acute renal failure and placed on HD on 06/26/13 at [facility].  Since discharge he had progressive SOB and was found to be hypoxic at [facility].  In our ED he was found to be volume overloaded with bilateral pleural effusions and pulmonary edema.  He was admitted to the internal medicine teaching service and nephrology was consulted.  He was taken on 6/15 for HD and 7lbs of fluid was removed.  After dialysis his SOB did improve and he has maintained 98-99% oxygen saturation on 2L via [place].  We discussed the case with Dr. Shon Davenport [facility] Nephrology) who has been working up and treating his ARF.  We discussed his care with our Nephrologist (Dr. Jonnie Finner) and the patient and agreed to transfer him to [facility] for continued treatment.  He will need further dialysis for volume overload.  ARF requiring HD  - Nephrology consulted and patient was taken for HD on 6/15 which removed 7 lbs of fluid.  He was taken for repeat dialysis on 6/16 due to delay in coordinating transfer.  Another 6 lbs were removed.  His respiratory status improved and he is being [facility] management.  Thrombocytopenia  - Plts  100k on admission, this dropped to 50k the next morning.  Patient did receive Heparin [place] on admission for DVT prophylaxis, heparin was discontinued and he was placed on SCDS.  A HIT panel was obtained and is pending.  Acute on Chronic Anemia - Hgb on presentation 8.5 g/dL, this dropped on repeat to 7.2g/dL.  Patient was asymptomatic and after HD repeat Hgb was 6.9 g/dL on hospital day 1.  His stool was examined and had no signs of blood or melena (to suggest large bleed), he had no other signs of bleeding.  Suspect his worsened anemia is likely dilutional effect of volume overload.  On repeat HD on hospital day 1 he was given 1 unit of PRBC. 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 Patient has had HIV ab testing at [facility] and at [facility] in May which were negative.  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 Although I wonder if the patient's Rheumatoid Arthritis could confound these results.  Patient is being transferred back to [facility] for further workup.  HTN:  - Patient was continued on his home metoprolol and amlodipine.  Rheumatoid Arthritis  - Methotrexate discontinued by [facility] as patient is on HD   Diarrhea Only 2 episodes, not grossly watery, no blood, did not suspect C diff at this time. Will hold off further treatment. Miralax discontinued (Acute Renal Failure + Diarrhea). This can be reevaluated at [facility].   Dispo: Patient transferred to [facility].  Of note daughter has concerns about [facility], would prefer for patient to be discharged to alternate SNF. Discharge Vitals:   BP 113/69  Pulse 95  Temp(Src) 98 F (36.7 C) (Oral)  Resp 18  Ht 5\' 3"  (1.6 m)  Wt 118 lb 6.2 oz (53.7 kg)  BMI 20.98 kg/m2  SpO2 100%  Discharge Labs:  Results for orders placed during the hospital encounter of 07/06/13 (from the past 24 hour(s))  BASIC  METABOLIC PANEL     Status: Abnormal   Collection Time    07/07/13  4:57 AM      Result Value Ref Range   Sodium 140  137 - 147 mEq/L   Potassium 4.3  3.7 - 5.3 mEq/L   Chloride 101  96 - 112 mEq/L   CO2 25  19 - 32 mEq/L   Glucose, Bld 87  70 - 99 mg/dL   BUN 33 (*) 6 - 23 mg/dL   Creatinine, Ser 2.95 (*) 0.50 - 1.35 mg/dL   Calcium 7.6 (*) 8.4 - 10.5 mg/dL   GFR calc non Af Amer 21 (*) >90 mL/min   GFR calc Af Amer 25 (*) >90 mL/min  CBC     Status: Abnormal   Collection Time    07/07/13  4:57 AM      Result Value Ref Range   WBC 15.6 (*) 4.0 - 10.5 K/uL   RBC 2.48 (*) 4.22 - 5.81 MIL/uL   Hemoglobin 6.9 (*) 13.0 - 17.0 g/dL   HCT 21.1 (*) 39.0 - 52.0 %   MCV 85.1  78.0 - 100.0 fL   MCH 27.8  26.0 - 34.0 pg   MCHC 32.7  30.0 - 36.0 g/dL   RDW 18.9 (*) 11.5 - 15.5 %   Platelets 50 (*) 150 - 400 K/uL  ABO/RH     Status: None   Collection Time    07/07/13  4:16 PM      Result Value Ref Range   ABO/RH(D) A POS    TYPE AND SCREEN     Status: None   Collection Time    07/07/13  4:30 PM      Result Value Ref Range   ABO/RH(D) A POS     Antibody Screen NEG     Sample Expiration 07/10/2013     Unit Number [id number]     Blood Component Type RED CELLS,LR     Unit division 00     Status of Unit ISSUED     Transfusion Status OK TO TRANSFUSE     Crossmatch Result Compatible    PREPARE RBC (CROSSMATCH)     Status: None   Collection Time    07/07/13  4:30 PM      Result Value Ref Range   Order Confirmation ORDER PROCESSED BY BLOOD BANK      Signed: Joni Reining, DO 07/07/2013, 7:21 PM   Time Spent on Discharge: 35 minutes Services Ordered on Discharge: none Equipment Ordered on Discharge: none

## 2013-07-07 NOTE — H&P (Signed)
INTERNAL MEDICINE TEACHING ATTENDING NOTE  Day 1 of stay  Patient name: Gabriel Davenport  MRN: [id number] Date of birth: [birth year]   63 y.o. with extensive past medical history of CHF, pericardial effusion, HD for ARF, just discharged from an acute care facility to SNF, where he developed hypoxia and was sent to [facility] ED. Currently doing well, has received one round of HD in [facility]. This morning when I met him he reported being better. Complains of loose BM since yesterday.  Filed Vitals:   07/07/13 0349 07/07/13 0500 07/07/13 0532 07/07/13 0923  BP:   100/59 110/64  Pulse:   106 100  Temp:   99.1 F (37.3 C) 98.4 F (36.9 C)  TempSrc:   Oral Oral  Resp:   18 18  Height:      Weight:  53 lb 3.2 oz (24.131 kg)    SpO2: 94%  99% 99%    Physical Exam   General: Resting in bed. Generalized weakness. HEENT: PERRL, EOMI, no scleral icterus, + JVD. Heart: RRR, no rubs, murmurs or gallops. Lungs: Rales upto midlung bilaterally. Abdomen: Soft, nontender, mild distended, BS present. Extremities: Warm, edema 2+ upto knees. Neuro: Alert and oriented X3.    Labs     Recent Labs Lab 07/06/13 1354  TROPONINI <0.30     Recent Labs Lab 07/06/13 1354 07/06/13 1844 07/07/13 0457  NA 138 139 140  K 4.7 4.5 4.3  CL 99 100 101  CO2 _0 GLUCOSE 76 99 87  BUN 67* 67* 33*  CREATININE 4.59* 4.47* 2.95*  CALCIUM 7.9* 7.6* 7.6*  PHOS  --  6.1*  --     Recent Labs Lab 07/06/13 1354 07/06/13 1844 07/07/13 0457  HGB 8.5* 7.2* 6.9*  HCT 27.0* 22.2* 21.1*  WBC 13.7* 15.7* 15.6*  PLT 103* 98* 50*   EKG reviewed.  Inpatient Medications   Scheduled Medications . amLODipine  10 mg Oral Daily  . budesonide-formoterol  2 puff Inhalation BID  . calcium carbonate  2 tablet Oral TID WC  . cholecalciferol  1,000 Units Oral Daily  . ferrous sulfate  325 mg Oral TID WC  . folic acid  1 mg Oral Daily  . furosemide  80 mg Oral Once per day on Sun Tue Thu Sat  . gabapentin  300 mg Oral  QHS  . hydroxychloroquine  200 mg Oral Daily  . metoprolol tartrate  25 mg Oral BID  . nystatin  5 mL Oral QID  . omega-3 acid ethyl esters  1 g Oral Daily  . pantoprazole  40 mg Oral Daily  . sodium chloride  3 mL Intravenous Q12H  . sodium chloride  3 mL Intravenous Q12H    PRN Medications sodium chloride, albuterol, polyvinyl alcohol, sodium chloride, traMADol   Assessment and Plan    CHF - On dialysis.   Renal Failure - We will continue dialysis per Dr Jonnie Finner.  Anemia with CHF - HgB trending down>>6.9. We will discuss PRBC transfusion with nephrology.   Medication management - Unclear home medication list. Needs reconciliation.  Transfer of care - The patient is taken care of at [facility], and per Dr Jodene Nam discussion with his nephrologist there, they would like to transfer care to [facility], so would the patient. Dr. Heber Nance has discussed this with Dr Jonnie Finner as well, and he thinks its best. The patient has stable vitals, and will be transferred to [facility] if remains stable.  I have seen and evaluated this patient and discussed it with my IM resident team.  Please see the rest of the plan per resident note from today.   Madilyn Fireman 07/07/2013, 12:35 PM.

## 2013-07-08 LAB — TYPE AND SCREEN
ABO/RH(D): A POS
Antibody Screen: NEGATIVE
Unit division: 0

## 2013-07-08 NOTE — Discharge Summary (Signed)
Reviewed and agree with documentation. Please see my note, and the resident note from the morning for more details.

## 2013-07-09 LAB — HEPARIN INDUCED THROMBOCYTOPENIA PNL
Heparin Induced Plt Ab: NEGATIVE
Patient O.D.: 0.155
UFH High Dose UFH H: 1 % Release
UFH LOW DOSE 0.1 IU/ML: 0 %
UFH Low Dose 0.5 IU/mL: 2 % Release
UFH SRA RESULT: NEGATIVE

## 2013-09-02 ENCOUNTER — Encounter: Payer: Self-pay | Admitting: Oncology

## 2014-07-31 IMAGING — US US ABDOMEN LIMITED
1 series · 4 of 4 positions shown · non-contrast
Comparison: CT abdomen and pelvis 05/18/2013

CLINICAL DATA: Ascites by CT, assessment for paracentesis

EXAM:
LIMITED ABDOMEN ULTRASOUND FOR ASCITES
TECHNIQUE: Limited ultrasound survey for ascites was performed in all four
abdominal quadrants.

[Series 1: us abdomen limited · 0.20mm/px · 4 of 4 slices shown]
[im 1/4]
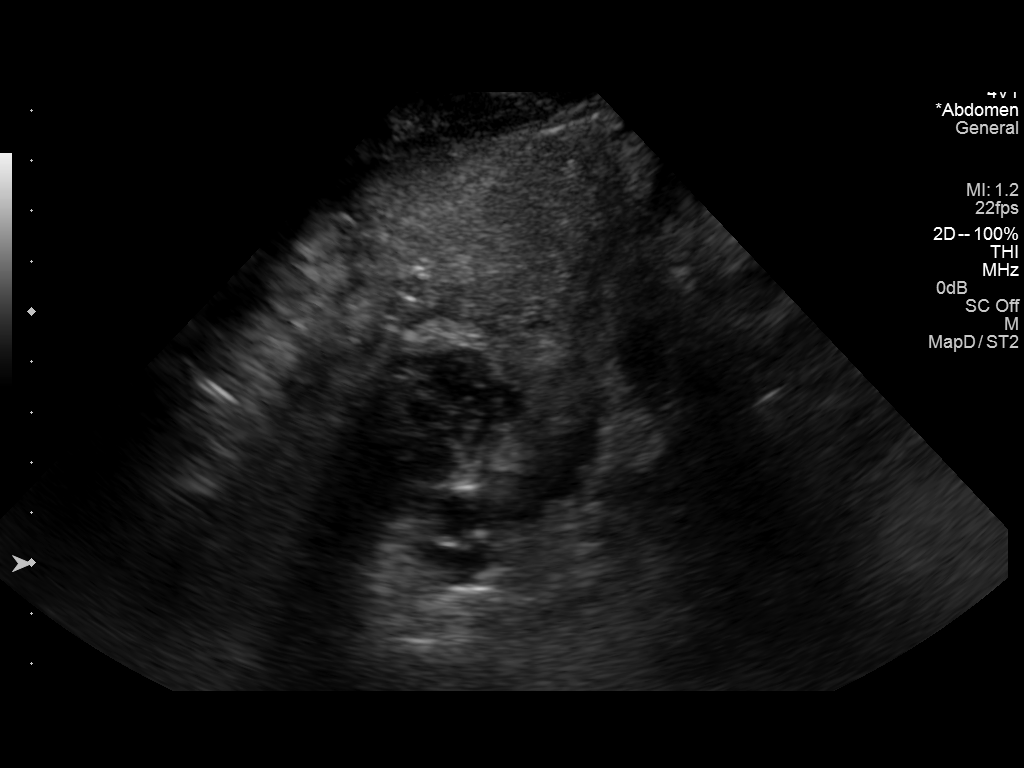
[im 2/4]
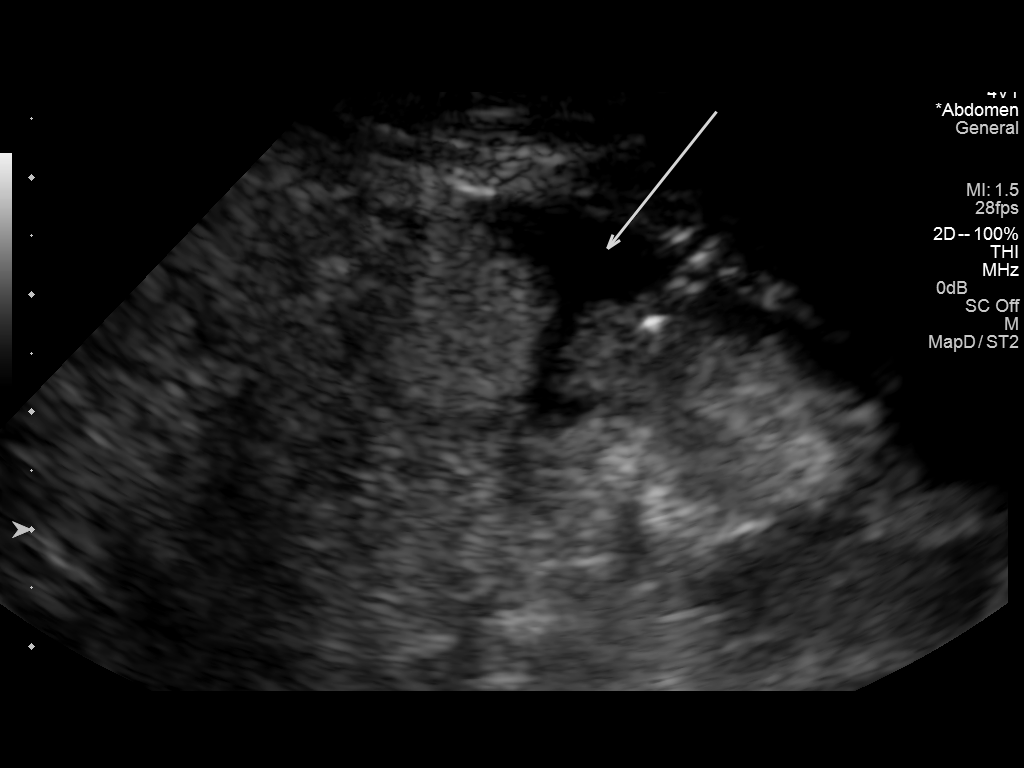
[im 3/4]
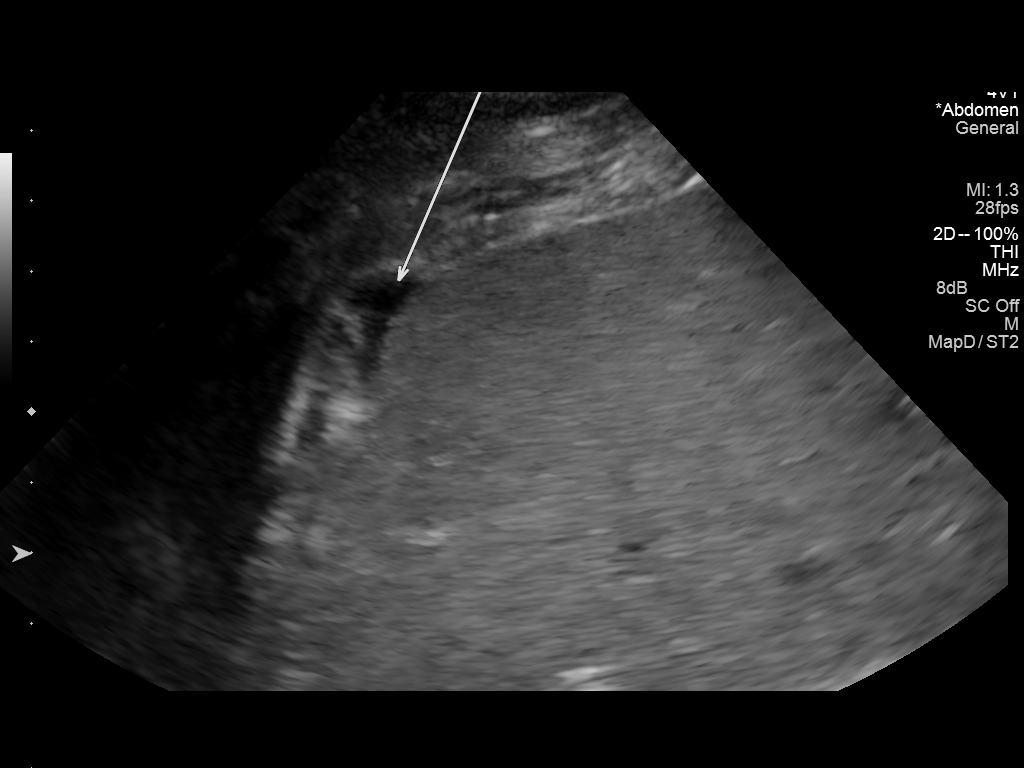
[im 4/4]
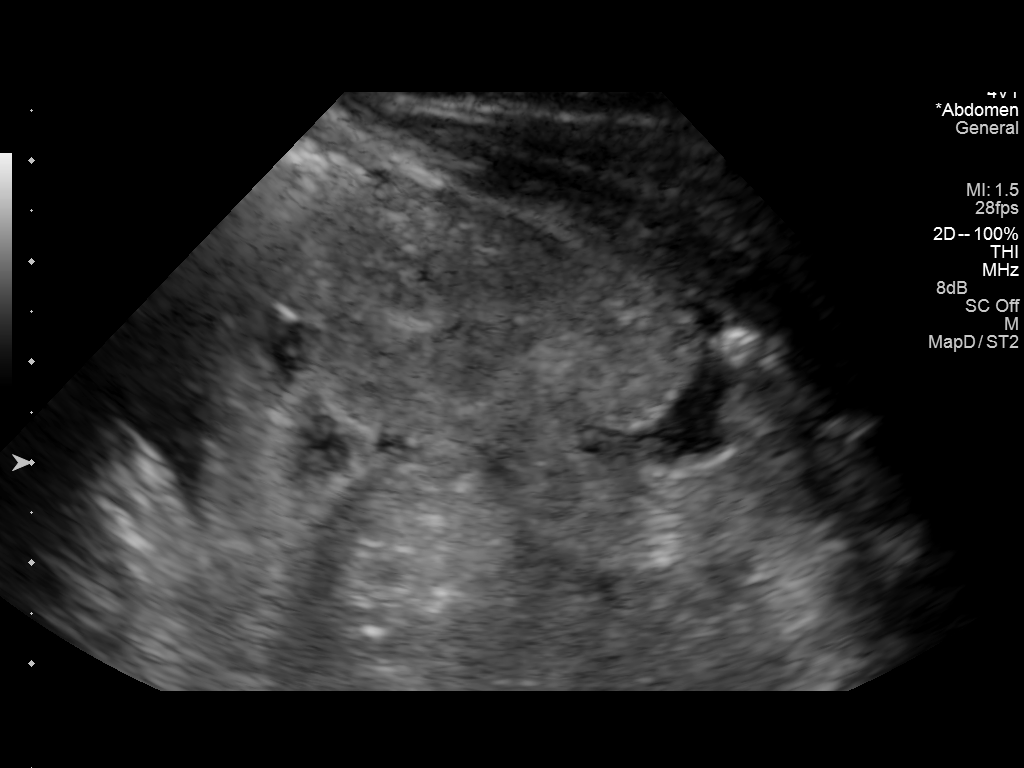

[4 of 4 positions shown; findings below may reference images not displayed]

FINDINGS: Survey imaging of the 4 quadrants was performed to assess for
ascites.

Only minimal ascites identified in the lower quadrants bilaterally
and adjacent the liver.

Volume of ascites sonographically visualized is insufficient for
paracentesis.
IMPRESSION: Minimal ascites, insufficient for paracentesis.

## 2014-08-01 IMAGING — CR DG CHEST 1V PORT
1 series · 1 of 1 positions shown · non-contrast
Comparison: DG CHEST 2 VIEW dated 05/21/2013; CT CHEST W/O CM dated
05/18/2013 .

CLINICAL DATA: Shortness of breath.  Pleural effusions.

EXAM:
PORTABLE CHEST - 1 VIEW

[portable]
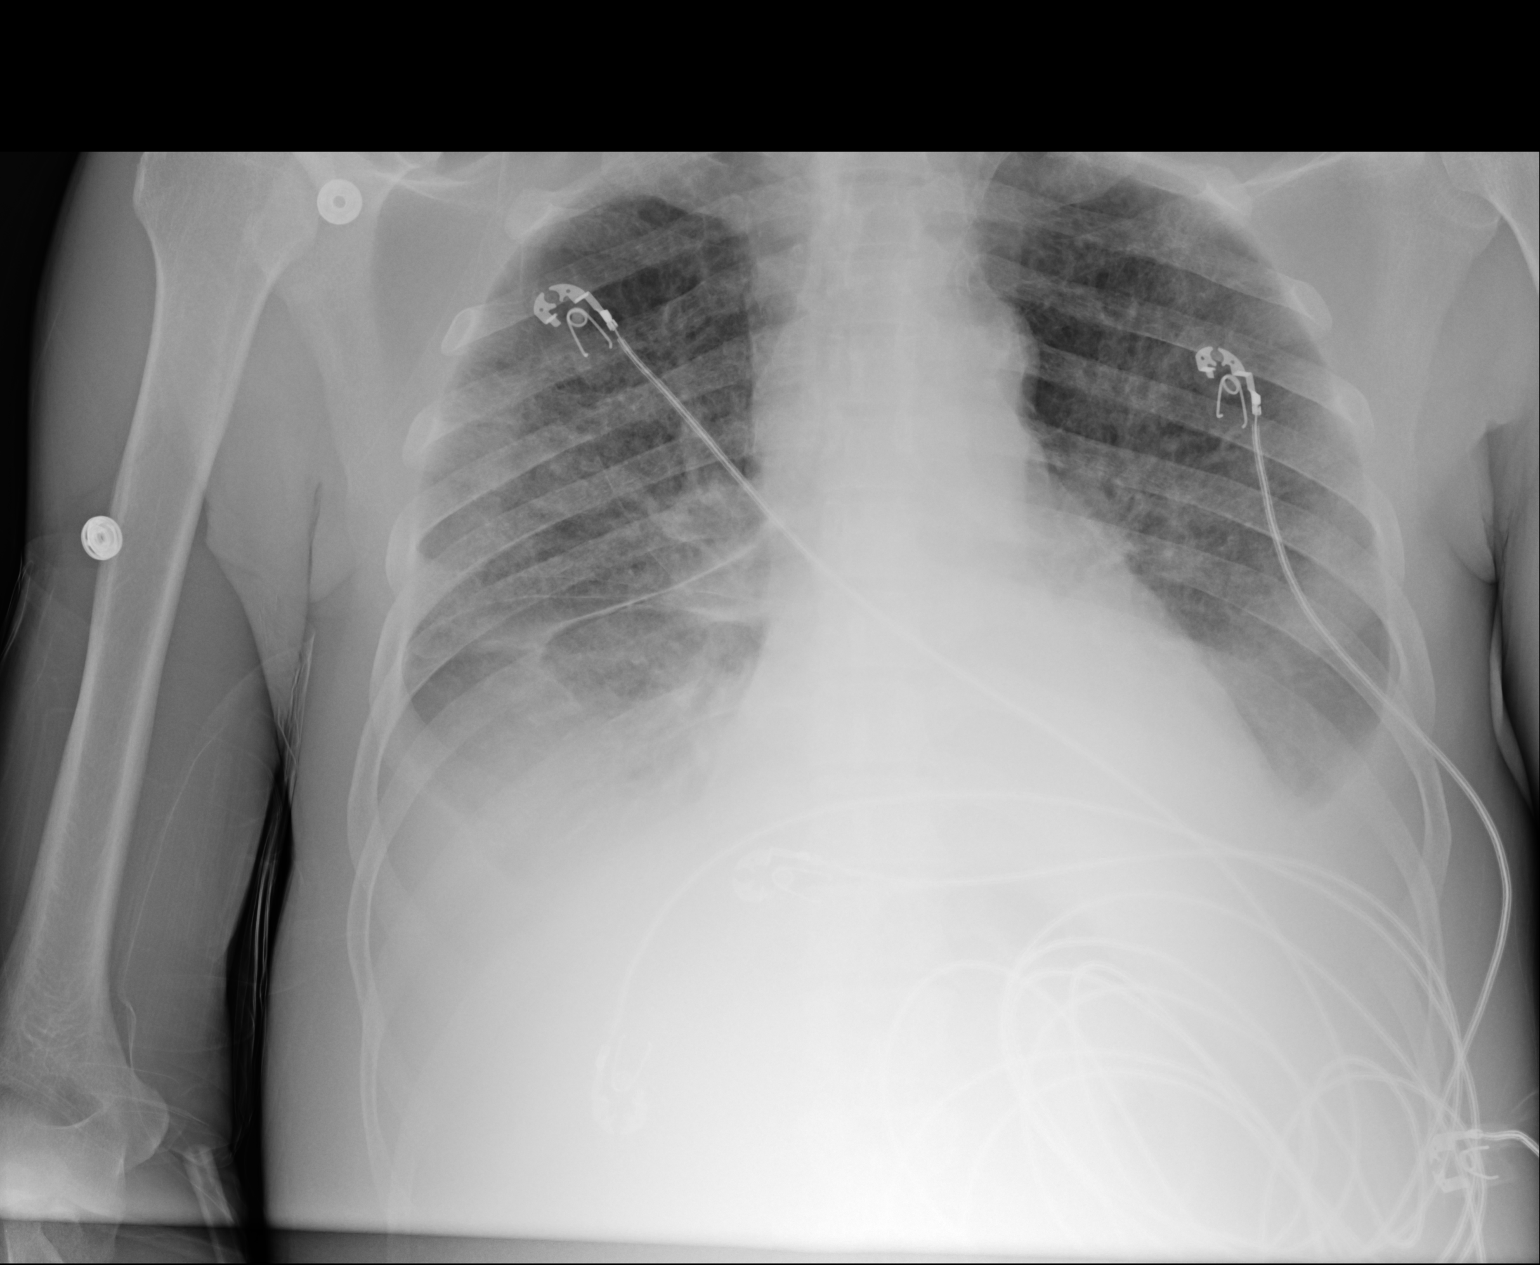

[1 of 1 positions shown; findings below may reference images not displayed]

FINDINGS: Mild prominence of mediastinum noted consistent with patient's known
mediastinal adenopathy. Persistent unchanged pleural effusions are
present. Heart size and pulmonary vascularity normal. Developing
bilateral pulmonary interstitial infiltrates are present. These
changes could be related to pneumonitis, acute congestive heart
failure, or process such as drug reaction. Basilar atelectasis. No
focal bony abnormality identified.
IMPRESSION: 1. Developing diffuse interstitial prominence. Differential
diagnosis includes pneumonitis, acute congestive heart failure, or
process such as drug reaction.
2. Mediastinal adenopathy, better demonstrated on prior recent CT of
05/18/2013.
[DATE]. Persistent unchanged pleural effusions.
4. Heart size and pulmonary vascularity are normal.

## 2016-08-24 ENCOUNTER — Emergency Department (HOSPITAL_COMMUNITY)
Admission: EM | Admit: 2016-08-24 | Discharge: 2016-08-24 | Disposition: A | Discharge status: Home / Self Care | Payer: Medicare Other | Attending: Emergency Medicine | Admitting: Emergency Medicine

## 2016-08-24 ENCOUNTER — Emergency Department (HOSPITAL_COMMUNITY): Payer: Medicare Other

## 2016-08-24 ENCOUNTER — Encounter (HOSPITAL_COMMUNITY): Payer: Self-pay | Admitting: *Deleted

## 2016-08-24 DIAGNOSIS — R1030 Lower abdominal pain, unspecified: Secondary | ICD-10-CM | POA: Insufficient documentation

## 2016-08-24 DIAGNOSIS — Z87891 Personal history of nicotine dependence: Secondary | ICD-10-CM | POA: Insufficient documentation

## 2016-08-24 DIAGNOSIS — M5440 Lumbago with sciatica, unspecified side: Secondary | ICD-10-CM | POA: Insufficient documentation

## 2016-08-24 DIAGNOSIS — M545 Low back pain: Secondary | ICD-10-CM | POA: Diagnosis present

## 2016-08-24 DIAGNOSIS — I509 Heart failure, unspecified: Secondary | ICD-10-CM | POA: Diagnosis not present

## 2016-08-24 DIAGNOSIS — Z79899 Other long term (current) drug therapy: Secondary | ICD-10-CM | POA: Diagnosis not present

## 2016-08-24 DIAGNOSIS — M25511 Pain in right shoulder: Secondary | ICD-10-CM | POA: Diagnosis not present

## 2016-08-24 DIAGNOSIS — I110 Hypertensive heart disease with heart failure: Secondary | ICD-10-CM | POA: Diagnosis not present

## 2016-08-24 DIAGNOSIS — M542 Cervicalgia: Secondary | ICD-10-CM | POA: Insufficient documentation

## 2016-08-24 DIAGNOSIS — R079 Chest pain, unspecified: Secondary | ICD-10-CM | POA: Diagnosis not present

## 2016-08-24 MED ORDER — IBUPROFEN 200 MG PO TABS
600.0000 mg | ORAL_TABLET | Freq: Once | ORAL | Status: AC
  Administered 2016-08-24: 600 mg via ORAL
  Filled 2016-08-24: qty 1

## 2016-08-24 MED ORDER — IBUPROFEN 600 MG PO TABS: 600.0000 mg | ORAL_TABLET | Freq: Three times a day (TID) | ORAL | 0 refills | Status: AC | PRN
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
## 2016-08-24 NOTE — Discharge Instructions (Signed)
Use ibuprofen up to 3 times a day as needed for pain. Take this with meals. Do not take other anti-inflammatories at the same time (Advil, Motrin, naproxen, aleve).  Continue using your at-home medications. You will likely have continued muscle pain, stiffness, and soreness over the next several days. Follow-up with your primary care doctor in 1 week if your pain is persisting. Return to the emergency department if you develop vomiting, loss of bowel or bladder control, worsening pain, inability to walk, or any new or worsening symptoms.

## 2020-02-23 DEATH — deceased
